# Patient Record
Sex: Female | Born: 1970 | Race: Black or African American | Hispanic: No | Smoking: Never smoker
Health system: Southern US, Community
[De-identification: ages and names within clinical notes are randomized; demographics above are authoritative.]

## PROBLEM LIST (undated history)

## (undated) DIAGNOSIS — F32A Depression, unspecified: Secondary | ICD-10-CM

## (undated) DIAGNOSIS — D649 Anemia, unspecified: Secondary | ICD-10-CM

## (undated) DIAGNOSIS — F329 Major depressive disorder, single episode, unspecified: Secondary | ICD-10-CM

## (undated) DIAGNOSIS — K219 Gastro-esophageal reflux disease without esophagitis: Secondary | ICD-10-CM

## (undated) HISTORY — PX: OTHER SURGICAL HISTORY: SHX169

## (undated) HISTORY — PX: UTERINE FIBROID EMBOLIZATION: SHX825

---

## 2000-01-26 HISTORY — PX: LAPAROTOMY: SHX154

## 2000-03-01 ENCOUNTER — Ambulatory Visit: Admission: RE | Admit: 2000-03-01 | Discharge: 2000-03-01 | Payer: Self-pay | Admitting: Gynecology

## 2000-03-30 ENCOUNTER — Encounter (INDEPENDENT_AMBULATORY_CARE_PROVIDER_SITE_OTHER): Payer: Self-pay | Admitting: Specialist

## 2000-03-30 ENCOUNTER — Inpatient Hospital Stay (HOSPITAL_COMMUNITY): Admission: RE | Admit: 2000-03-30 | Discharge: 2000-03-31 | Payer: Self-pay | Admitting: Gynecology

## 2000-06-14 ENCOUNTER — Ambulatory Visit: Admission: RE | Admit: 2000-06-14 | Discharge: 2000-06-14 | Payer: Self-pay | Admitting: Gynecology

## 2000-07-06 ENCOUNTER — Other Ambulatory Visit: Admission: RE | Admit: 2000-07-06 | Discharge: 2000-07-06 | Payer: Self-pay | Admitting: Gynecology

## 2000-07-12 ENCOUNTER — Ambulatory Visit: Admission: RE | Admit: 2000-07-12 | Discharge: 2000-07-12 | Payer: Self-pay | Admitting: Gynecology

## 2001-07-20 ENCOUNTER — Other Ambulatory Visit: Admission: RE | Admit: 2001-07-20 | Discharge: 2001-07-20 | Payer: Self-pay | Admitting: Gynecology

## 2002-08-17 ENCOUNTER — Other Ambulatory Visit: Admission: RE | Admit: 2002-08-17 | Discharge: 2002-08-17 | Payer: Self-pay | Admitting: Gynecology

## 2003-07-31 ENCOUNTER — Other Ambulatory Visit: Admission: RE | Admit: 2003-07-31 | Discharge: 2003-07-31 | Payer: Self-pay | Admitting: Gynecology

## 2004-08-13 ENCOUNTER — Other Ambulatory Visit: Admission: RE | Admit: 2004-08-13 | Discharge: 2004-08-13 | Payer: Self-pay | Admitting: Gynecology

## 2005-07-26 ENCOUNTER — Other Ambulatory Visit: Admission: RE | Admit: 2005-07-26 | Discharge: 2005-07-26 | Payer: Self-pay | Admitting: Gynecology

## 2006-08-10 ENCOUNTER — Other Ambulatory Visit: Admission: RE | Admit: 2006-08-10 | Discharge: 2006-08-10 | Payer: Self-pay | Admitting: Gynecology

## 2007-08-25 ENCOUNTER — Other Ambulatory Visit: Admission: RE | Admit: 2007-08-25 | Discharge: 2007-08-25 | Payer: Self-pay | Admitting: Gynecology

## 2010-05-07 ENCOUNTER — Other Ambulatory Visit (HOSPITAL_COMMUNITY): Payer: Self-pay | Admitting: Gynecology

## 2010-05-07 DIAGNOSIS — N979 Female infertility, unspecified: Secondary | ICD-10-CM

## 2010-05-12 ENCOUNTER — Ambulatory Visit (HOSPITAL_COMMUNITY)
Admission: RE | Admit: 2010-05-12 | Discharge: 2010-05-12 | Disposition: A | Discharge status: Home / Self Care | Payer: BC Managed Care – PPO | Source: Ambulatory Visit | Attending: Gynecology | Admitting: Gynecology

## 2010-05-12 DIAGNOSIS — N979 Female infertility, unspecified: Secondary | ICD-10-CM

## 2010-06-12 NOTE — Consult Note (Signed)
[facility]  Patient:    Valerie Barnes, Valerie Barnes                  MRN: [id number] Proc. Date: 07/12/00 Adm. Date:  [id number] Attending:  Jeannette Corpus CC:         [name], M.D.  Telford Nab, R.N.   Consultation Report  HISTORY OF PRESENT ILLNESS:  A 40 year old African-American female returns for follow-up of separation of the posterior fornix following partial vaginectomy and resection of rectus vaginal septum endometriosis deposit.  Since she was seen on May 21, she denies any vaginal bleeding or discharge and has no pelvic pain or pressure.  She has no GI or GU symptoms.  PHYSICAL EXAMINATION:  ABDOMEN:  Soft and nontender.  No masses, organomegaly, ascites, or hernias are noted.  Her incision is well healed.  PELVIC:  EGBUS normal.  The vagina is clean.  The cervix is nulliparous and clean.  Inspection of the vagina, including the posterior cul-de-sac, shows no lesions or separation.  Mucosa appears healthy.  Bimanual exam reveals some thickening in the posterior cul-de-sac without any discrete masses.  This is slightly uncomfortable for the patient.  IMPRESSION:  Good postoperative rehealing.  The patient can resume sexual intercourse.  We will have her return to Dr. Teodora Medici, her primary gynecologist. DD:  07/12/00 TD:  07/12/00 Job: [id number]

## 2010-06-12 NOTE — Op Note (Signed)
[facility]  Patient:    Valerie Barnes, Valerie Barnes                  MRN: [id number] Proc. Date: 03/30/00 Adm. Date:  [id number] Attending:  Rolinda Roan CC:         [name], M.D.  Christin Bach, M.D., [place], c/o [facility]  Telford Nab, R.N.   Operative Report  PREOPERATIVE DIAGNOSES: 1. Pelvic pain. 2. Rectovaginal septum mass.  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  OPERATION: 1. Exploratory laparotomy. 2. Resection of mass from rectovaginal septum and posterior vagina. 3. Repair of posterior vagina colpotomy.  SURGEONS:  Daniel L. Clarke-Pearson, M.D.  ASSISTANT:  1. [name], M.D.             2. Telford Nab, R.N.  ANESTHESIA:  General with orotracheal tube.  ESTIMATED BLOOD LOSS: 50 cc.  SURGICAL FINDINGS:  At examination under anesthesia, there was noted to be an approximately 3 cm nodular mass immediately adjacent to the posterior cervix in the rectovaginal septum.  At exploratory laparotomy, the tubes and ovaries appeared normal.  The uterus had a 5 mm fibroid on its fundus.  There was no evidence of endometriosis on any peritoneal surfaces.  However, there was a mass measuring approximately 3 cm in the rectovaginal septum which, on frozen section, was determined to be endometriosis.  No gross disease is left behind at the completion of the surgical procedure.  DESCRIPTION OF PROCEDURE:  The patient was brought to the operating room, and after satisfactory attainment of general anesthesia, she was examined with the above-noted findings.  The abdomen, perineum, and vagina were prepped with Betadine, Foley catheter was placed, and the patient was draped.  The abdomen was entered through a Pfannenstiel incision and explored with the above-noted findings.  Buchwalter retractor was assembled, and the bowel was packed out of the pelvis.  Care was taken to avoid  pressure on the psoas muscle.  The uterus was held forward using a narrow Deaver on the Buchwalter retractor with care taken to avoid injury to the fallopian tubes and ovaries.  With the cul-de-sac exposed, an incision was made in the posterior cul-de-sac.  using blunt and sharp dissection and cautery for hemostasis, the rectum was mobilized away from the posterior vagina.  The incision in the peritoneum was then extended anteriorly along the back of the cervix and superior cul-de-sac.  The mass itself was put on traction with a suture placed through the mass.  Using sharp and blunt dissection and Bovie cautery for hemostasis, the mass was resected along with a 1 x 2 cm portion of the posterior vagina.  Inspection suggested that the lesion extended into two sinus tracts in the posterior vagina.  The mass is submitted for frozen section, and we were told that this is endometriosis.  The posterior vagina was closed with a series of interrupted figure-of-eight sutures of 2-0 Vicryl.  The posterior cul-de-sac peritoneum was reincorporated into the posterior vagina, and the peritoneum adjacent to the cervix with interrupted sutures of 2-0 Vicryl.  The pelvis was irrigated with copious amounts of warm saline.  All blood and clot was removed.  This was reinspected.  Hemostasis appeared to be excellent.  The packs and retractors were removed, and the anterior abdominal wall closed in layers, the first being a running suture of 2-0 Vicryl on the peritoneum. Hemostasis was achieved in the subfascial region and the fascia closed with a running  suture of 0 Vicryl.  Subcutaneous tissue was irrigated.  Hemostasis was achieved with cautery, and the skin was closed with skin staples.  A dressing was applied.  The patient was awakened from anesthesia and taken to the recovery room in satisfactory condition.  Sponge, needle, and instrument counts were correct x 2. DD:  03/30/00 TD:  03/30/00 Job:  [id number]

## 2010-06-12 NOTE — Consult Note (Signed)
[facility]  Patient:    Valerie Barnes, Valerie Barnes                  MRN: [id number] Proc. Date: 06/14/00 Adm. Date:  [id number] Attending:  Jeannette Corpus CC:         [name], M.D.             Telford Nab, R.N.                          Consultation Report  HISTORY OF PRESENT ILLNESS:  [age]-year-old African-American female seen at the request of Dr. Dimas Aguas C. Mezer.  She underwent resection of a posterior cul-de-sac nodule and upper portion of the posterior cul-de-sac and vagina on March 6th for severe endometriosis involving the rectovaginal septum.  She had an uncomplicated postoperative course and has done reasonably well, until she had intercourse several days ago.  She noted vaginal bleeding thereafter and saw Dr. Chevis Pretty, who thought there was a separation in the posterior vagina, and she is referred here for further evaluation.  She denies any pelvic pain, pressure, vaginal bleeding, discharge or fever or chills.  She has no abnormal GI or GU function.  PHYSICAL EXAMINATION:  GENERAL:  Weight 150 pounds.  ABDOMEN:  Soft, nontender.  No mass, organomegaly, ascites or herniae noted. Her incision is well-healed.  PELVIC:  EGBUS normal.  The vagina is clean up to the posterior fornix.  There is some slight separation of the mucosa, although there is no break through the stroma of the vagina.  This is carefully inspected and carefully palpated. Cervix otherwise appeared normal.  IMPRESSION:  Most likely, the patient has had a slight tear in the vagina, although I do not see that this is a through-and-through tear.  PLAN:  I have given the patient a prescription for Premarin cream to be used every other night for the next two weeks and I will have her return to see me. She will abstain from intercourse during that time. DD:  06/14/00 TD:  06/15/00 Job: [id number]

## 2010-06-12 NOTE — H&P (Signed)
[facility]  Patient:    Valerie Barnes, Valerie Barnes                         MRN: [id number] Attending:  [name], M.D. CC:         Christin Bach, M.D., [place], [place]  Rande Brunt. Clarke-Pearson, M.D.   History and Physical  ADMISSION DIAGNOSIS:  Pelvic pain, question pelvic mass.  HISTORY OF PRESENT ILLNESS:  The patient is a 40 year old nulligravida black female who was kindly referred by Dr. Christin Bach for evaluation and treatment of pelvic pain.  The patient presented in January 2002 complaining of a long history of severe dysmenorrhea and pelvic pain since November 2001. The pain occurs quite frequently and is mostly at midline with radiation to the legs.  The patient denies any change in bowel habits or significant bowel symptoms.  No dysuria.  She has had no sexually transmitted diseases and has not been recently sexually active.  She has had bleeding from the posterior apex of the vagina and Dr. Emelda Fear performed a biopsy of this area and the pathology report returned as nodules of granulation tissue exhibiting deposits of hemosiderin in the submucosa and acanthotic focally parakeratotic with stratified squamous mucosa overlying them with no glandular or stromal components of endometriosis identified.  On examination, there appeared to be a firm area at the cul-de-sac between the uterosacral ligaments, which did not extend to the rectum.  As this finding was suggestive of endometriosis but not typical of rectovaginal endometriosis as the rectosigmoid was not drawn up into this area, an upper GI with small bowel follow-through was obtained, which was negative.  The patient has been seen in consultation with Dr. Serita Kyle and he was unable to appreciate the cul-de-sac mass. Because of this uncertainty of the diagnosis, the proposed procedure is to perform examination under anesthesia with probable laparoscopy and laparotomy if indicated.  The  patient understands the uncertainty of the diagnosis and the potential extent of the procedures up to and including a bowel resection and anastomosis.  The patient also understands that there is a possibility that there will be no significant pathology found either at the EUA or the laparoscopy.  Laparoscopy and laparotomy with potential complications including but not limited to injury to the bowel, bladder, ureters, and possible infection and possible blood loss or transfusion and its sequelae. She also understands that there is no guarantee to find the cause of or to relieve her pelvic pain.  The patient has undergone a bowel prep preoperatively.  The patient appears to understand the complexity of the desicion-making process and the rationale for the above procedures.  PAST SURGICAL HISTORY:  None.  PAST MEDICAL HISTORY:  Noncontributory.  MEDICATIONS: 1. Prozac. 2. Lo/Ovral.  ALLERGIES:  CODEINE.  SOCIAL HISTORY:  Smokes none, ETOH none.  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  FAMILY HISTORY:  Noncontributory.  PHYSICAL EXAMINATION:  HEENT:  Negative.  HEART:  Without murmurs.  LUNGS:  Clear.  BREASTS:  Without masses or discharge.  ABDOMEN:  Soft and nontender.  PELVIC:  BUS, vagina, and cervix normal except for a small bleeding area at the posterior aspect of the vagina, which appears to be firm.  The cervix is normal.  The uterus is anterior in normal position.  The adnexa are without masses.  Rectal exam is negative.  On rectovaginal exam, there appears to be a  firm area in the cul-de-sac between the uterosacral ligaments, which has not extended to the rectum.  EXTREMITIES:  Negative.  IMPRESSION:  Pelvic pain and dysmenorrhea.  Question pelvic mass.  PLAN:  Examination under anesthesia, diagnostic laparoscopy, question laparotomy with indicated procedures. DD:  03/30/00 TD:  03/30/00 Job:  [id number]

## 2010-06-12 NOTE — Consult Note (Signed)
[facility]  Patient:    Valerie Barnes, Valerie Barnes                       MRN: [id number] Proc. Date: 03/01/00 Adm. Date:  [id number] Attending:  Jeannette Corpus CC:         Valerie Barnes, M.D.  Christin Bach, M.D. [address] [place], [place] [address]   Consultation Report  HISTORY OF PRESENT ILLNESS:  The patient is a 40 year old African-American female referred by Dr. Teodora Medici in consultation regarding management of pelvic pain and a newly diagnosed cul-de-sac mass.  The patient has been on birth control pills for approximately 16 years, more recently, given in a continuous fashion for treatment of presumed endometriosis.  The patient has a history of chronic pelvic pain.  More recently, she has had some irregular vaginal bleeding and pelvic pain which is diffuse and throughout the pelvis, ultimately becoming a "throbbing" pain in the upper vagina.  The patient denies any rectal symptoms, and has no GI or GU symptoms.  From a gynecologic perspective, she has no significant past history.  She has no history of abnormal Pap smears, ovarian cysts, or any gynecologic surgery. She has not ever had laparoscopy, and the diagnosis of endometriosis is presumed.  The patient apparently had a lesion in the posterior vagina which was biopsied by Dr. Christin Bach in [place].  This showed hemosiderin, but did not show any endometrial glands or stroma.  She has been referred to Dr. Chevis Pretty for evaluation and management of presumed endometriosis.  A cul-de-sac mass was felt, and surgery was scheduled to manage this mass.  The patient has had an upper GI series which was normal, ruling out Crohns disease.  PAST MEDICAL HISTORY:  None.  PAST SURGICAL HISTORY:  None.  ALLERGIES:  CODEINE.  CURRENT MEDICATIONS:  Oral contraceptives.  FAMILY HISTORY:  Negative for gynecologic, breast, or colon cancers.  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  PHYSICAL EXAMINATION:  VITAL SIGNS:  Height 5 feet 3 inches, weight 146 pounds, blood pressure 136/84, pulse 84, respiratory rate 24.  GENERAL:  The patient is a pleasant healthy African-American female in no acute distress.  HEENT:  Negative.  NECK:  Supple without thyromegaly.  ABDOMEN:  Soft, nontender, no masses, organomegaly, ascites, or hernias are noted.  PELVIC:  EGBUS normal.  The vagina seems normal.  I do not see any lesions in the posterior vagina today.  The cervix is nulliparous.  Bimanual examination reveals an anterior uterus.  She has very hard stool in the rectum, but I am unable to identify a true cul-de-sac mass or any other adnexal pathology.  IMPRESSION:  Chronic pelvic pain and irregular bleeding.  The patient may well have endometriosis, and it may be that I am missing detecting the mass because the patient has such hard stool in her rectum.  Dr. Chevis Pretty and I have consulted over the phone, and we have agreed to proceed with further evaluation, including examination under anesthesia, and thereafter to determine whether we should proceed with either diagnostic laparoscopy, or proceed immediately to a laparotomy.  I had a lengthy discussion with the patient and her mother regarding these management options.  It was emphasized that if she does have endometriosis in her posterior cul-de-sac, surgical resection would be appropriate, and that we would make every effort to avoid compromising fertility.  On the other hand, they  are aware that this surgery in cul-de-sac may result in injury to the rectum and/or rectal resection with reanastomosis.  She will have a mechanical bowel prep and intravenous antibiotics perioperatively.  The risks of surgery, including hemorrhage, infection, injury to adjacent organs, and thromboembolic complications were outlined to the patient and her mother.  They are in agreement, and we will  proceed with surgery on March 30, 2000. DD:  03/01/00 TD:  03/02/00 Job: [id number]

## 2010-08-25 ENCOUNTER — Other Ambulatory Visit: Payer: Self-pay | Admitting: Gynecology

## 2010-08-25 DIAGNOSIS — R928 Other abnormal and inconclusive findings on diagnostic imaging of breast: Secondary | ICD-10-CM

## 2010-09-01 ENCOUNTER — Ambulatory Visit
Admission: RE | Admit: 2010-09-01 | Discharge: 2010-09-01 | Disposition: A | Discharge status: Home / Self Care | Payer: BC Managed Care – PPO | Source: Ambulatory Visit | Attending: Gynecology | Admitting: Gynecology

## 2010-09-01 DIAGNOSIS — R928 Other abnormal and inconclusive findings on diagnostic imaging of breast: Secondary | ICD-10-CM

## 2010-09-03 ENCOUNTER — Other Ambulatory Visit: Payer: BC Managed Care – PPO

## 2011-12-10 ENCOUNTER — Other Ambulatory Visit: Payer: Self-pay | Admitting: Gynecology

## 2011-12-10 DIAGNOSIS — D259 Leiomyoma of uterus, unspecified: Secondary | ICD-10-CM

## 2011-12-10 DIAGNOSIS — N920 Excessive and frequent menstruation with regular cycle: Secondary | ICD-10-CM

## 2011-12-16 ENCOUNTER — Ambulatory Visit
Admission: RE | Admit: 2011-12-16 | Discharge: 2011-12-16 | Disposition: A | Discharge status: Home / Self Care | Payer: BC Managed Care – PPO | Source: Ambulatory Visit | Attending: Gynecology | Admitting: Gynecology

## 2011-12-16 DIAGNOSIS — N920 Excessive and frequent menstruation with regular cycle: Secondary | ICD-10-CM

## 2011-12-16 DIAGNOSIS — D259 Leiomyoma of uterus, unspecified: Secondary | ICD-10-CM

## 2011-12-19 ENCOUNTER — Ambulatory Visit
Admission: RE | Admit: 2011-12-19 | Discharge: 2011-12-19 | Disposition: A | Discharge status: Home / Self Care | Payer: BC Managed Care – PPO | Source: Ambulatory Visit | Attending: Gynecology | Admitting: Gynecology

## 2011-12-19 DIAGNOSIS — D259 Leiomyoma of uterus, unspecified: Secondary | ICD-10-CM

## 2011-12-19 DIAGNOSIS — N920 Excessive and frequent menstruation with regular cycle: Secondary | ICD-10-CM

## 2011-12-19 MED ORDER — GADOBENATE DIMEGLUMINE 529 MG/ML IV SOLN
15.0000 mL | Freq: Once | INTRAVENOUS | Status: AC | PRN
  Administered 2011-12-19: 15 mL via INTRAVENOUS

## 2011-12-22 ENCOUNTER — Telehealth: Payer: Self-pay | Admitting: Emergency Medicine

## 2011-12-22 NOTE — Telephone Encounter (Signed)
S/W PT. DR Fredia Sorrow SAID MRI WAS GOOD TO GO FOR UFE.  WILL SUBMIT TO INS. AND HAVE TINA AT [facility] TO CONTACT PT. TO SET UP PROCEDURE.

## 2011-12-30 ENCOUNTER — Telehealth: Payer: Self-pay | Admitting: Emergency Medicine

## 2011-12-30 NOTE — Telephone Encounter (Signed)
LM TO MAKE PT. AWARE THAT [place] WAS APPROVED BY INS.

## 2012-01-18 ENCOUNTER — Encounter (HOSPITAL_COMMUNITY): Payer: Self-pay | Admitting: Pharmacy Technician

## 2012-01-25 ENCOUNTER — Other Ambulatory Visit: Payer: Self-pay | Admitting: Radiology

## 2012-01-27 ENCOUNTER — Other Ambulatory Visit: Payer: Self-pay | Admitting: Interventional Radiology

## 2012-01-27 DIAGNOSIS — D259 Leiomyoma of uterus, unspecified: Secondary | ICD-10-CM

## 2012-01-28 ENCOUNTER — Observation Stay (HOSPITAL_COMMUNITY)
Admission: RE | Admit: 2012-01-28 | Discharge: 2012-01-29 | Disposition: A | Discharge status: Home / Self Care | Payer: BC Managed Care – PPO | Source: Ambulatory Visit | Attending: Interventional Radiology | Admitting: Interventional Radiology

## 2012-01-28 ENCOUNTER — Ambulatory Visit (HOSPITAL_COMMUNITY)
Admission: RE | Admit: 2012-01-28 | Discharge: 2012-01-28 | Disposition: A | Discharge status: Home / Self Care | Payer: BC Managed Care – PPO | Source: Ambulatory Visit | Attending: Interventional Radiology | Admitting: Interventional Radiology

## 2012-01-28 ENCOUNTER — Other Ambulatory Visit: Payer: Self-pay | Admitting: Interventional Radiology

## 2012-01-28 ENCOUNTER — Encounter (HOSPITAL_COMMUNITY): Payer: Self-pay

## 2012-01-28 VITALS — BP 120/67 | HR 86 | Temp 99.3°F | Resp 16 | Ht 65.0 in | Wt 160.0 lb

## 2012-01-28 DIAGNOSIS — D259 Leiomyoma of uterus, unspecified: Secondary | ICD-10-CM

## 2012-01-28 LAB — BASIC METABOLIC PANEL
BUN: 12 mg/dL (ref 6–23)
BUN: 11 mg/dL (ref 6–23)
CO2: 24 mEq/L (ref 19–32)
CO2: 24 mEq/L (ref 19–32)
Calcium: 9.6 mg/dL (ref 8.4–10.5)
Calcium: 9.1 mg/dL (ref 8.4–10.5)
Chloride: 98 mEq/L (ref 96–112)
Chloride: 100 mEq/L (ref 96–112)
Creatinine, Ser: 0.73 mg/dL (ref 0.50–1.10)
Creatinine, Ser: 0.76 mg/dL (ref 0.50–1.10)
GFR calc Af Amer: >90 mL/min (ref >90)
GFR calc Af Amer: >90 mL/min (ref >90)
GFR calc non Af Amer: >90 mL/min (ref >90)
GFR calc non Af Amer: >90 mL/min (ref >90)
Glucose, Bld: 76 mg/dL (ref 70–99)
Glucose, Bld: 86 mg/dL (ref 70–99)
Potassium: 5.9 mEq/L — ABNORMAL HIGH (ref 3.5–5.1)
Potassium: 3.6 mEq/L (ref 3.5–5.1)
Sodium: 132 mEq/L — ABNORMAL LOW (ref 135–145)
Sodium: 134 mEq/L — ABNORMAL LOW (ref 135–145)

## 2012-01-28 LAB — CBC
HCT: 37.7 % (ref 36.0–46.0)
Hemoglobin: 12.5 g/dL (ref 12.0–15.0)
MCH: 27.5 pg (ref 26.0–34.0)
MCHC: 33.2 g/dL (ref 30.0–36.0)
MCV: 83 fL (ref 78.0–100.0)
Platelets: 274 10*3/uL (ref 150–400)
RBC: 4.54 MIL/uL (ref 3.87–5.11)
RDW: 13.2 % (ref 11.5–15.5)
WBC: 7.1 10*3/uL (ref 4.0–10.5)

## 2012-01-28 LAB — HCG, SERUM, QUALITATIVE: Preg, Serum: NEGATIVE

## 2012-01-28 LAB — APTT: aPTT: 31 seconds (ref 24–37)

## 2012-01-28 LAB — PROTIME-INR
INR: 0.97 (ref 0.00–1.49)
Prothrombin Time: 12.8 seconds (ref 11.6–15.2)

## 2012-01-28 MED ORDER — DOCUSATE SODIUM 100 MG PO CAPS
100.0000 mg | ORAL_CAPSULE | Freq: Two times a day (BID) | ORAL | Status: DC
  Administered 2012-01-28 – 2012-01-29 (×2): 100 mg via ORAL
  Filled 2012-01-28: qty 1

## 2012-01-28 MED ORDER — HYDROMORPHONE HCL PF 2 MG/ML IJ SOLN
2.0000 mg | Freq: Once | INTRAMUSCULAR | Status: AC
  Administered 2012-01-28: 2 mg via INTRAVENOUS
  Filled 2012-01-28: qty 1

## 2012-01-28 MED ORDER — DIPHENHYDRAMINE HCL 50 MG/ML IJ SOLN
12.5000 mg | Freq: Four times a day (QID) | INTRAMUSCULAR | Status: DC | PRN
  Filled 2012-01-28: qty 1

## 2012-01-28 MED ORDER — SODIUM CHLORIDE 0.45 % IV SOLN
INTRAVENOUS | Status: DC
  Administered 2012-01-28 – 2012-01-29 (×2): via INTRAVENOUS

## 2012-01-28 MED ORDER — ONDANSETRON HCL 4 MG/2ML IJ SOLN
4.0000 mg | Freq: Four times a day (QID) | INTRAMUSCULAR | Status: DC | PRN
  Administered 2012-01-29 (×2): 4 mg via INTRAVENOUS

## 2012-01-28 MED ORDER — SODIUM CHLORIDE 0.9 % IJ SOLN: 3.0000 mL | INTRAMUSCULAR | Status: DC | PRN

## 2012-01-28 MED ORDER — LIDOCAINE HCL 1 % IJ SOLN
INTRAMUSCULAR | Status: AC
  Administered 2012-01-28: 11:00:00
  Filled 2012-01-28: qty 20

## 2012-01-28 MED ORDER — FENTANYL CITRATE 0.05 MG/ML IJ SOLN
INTRAMUSCULAR | Status: AC
  Filled 2012-01-28: qty 8

## 2012-01-28 MED ORDER — FENTANYL CITRATE 0.05 MG/ML IJ SOLN
INTRAMUSCULAR | Status: AC | PRN
  Administered 2012-01-28: 50 ug via INTRAVENOUS
  Administered 2012-01-28: 100 ug via INTRAVENOUS
  Administered 2012-01-28: 50 ug via INTRAVENOUS
  Administered 2012-01-28: 100 ug via INTRAVENOUS

## 2012-01-28 MED ORDER — KETOROLAC TROMETHAMINE 30 MG/ML IJ SOLN
30.0000 mg | INTRAMUSCULAR | Status: AC
  Administered 2012-01-28: 30 mg via INTRAVENOUS
  Filled 2012-01-28: qty 1

## 2012-01-28 MED ORDER — SODIUM CHLORIDE 0.9 % IJ SOLN: 9.0000 mL | INTRAMUSCULAR | Status: DC | PRN

## 2012-01-28 MED ORDER — LORATADINE 10 MG PO TABS
10.0000 mg | ORAL_TABLET | Freq: Every day | ORAL | Status: DC
  Administered 2012-01-29: 10 mg via ORAL
  Filled 2012-01-28 (×2): qty 1

## 2012-01-28 MED ORDER — PROMETHAZINE HCL 25 MG RE SUPP
25.0000 mg | Freq: Three times a day (TID) | RECTAL | Status: DC | PRN
  Filled 2012-01-28: qty 1

## 2012-01-28 MED ORDER — HYDROMORPHONE HCL PF 2 MG/ML IJ SOLN
INTRAMUSCULAR | Status: AC
  Filled 2012-01-28: qty 1

## 2012-01-28 MED ORDER — IOHEXOL 300 MG/ML  SOLN
55.0000 mL | Freq: Once | INTRAMUSCULAR | Status: AC | PRN
  Administered 2012-01-28: 55 mL via INTRA_ARTERIAL

## 2012-01-28 MED ORDER — DIPHENHYDRAMINE HCL 50 MG/ML IJ SOLN
25.0000 mg | Freq: Once | INTRAMUSCULAR | Status: AC
  Administered 2012-01-28: 25 mg via INTRAVENOUS
  Filled 2012-01-28: qty 1

## 2012-01-28 MED ORDER — FENTANYL 10 MCG/ML IV SOLN
INTRAVENOUS | Status: DC
  Administered 2012-01-28: 165 ug via INTRAVENOUS
  Administered 2012-01-28 (×2): via INTRAVENOUS
  Administered 2012-01-28: 105 ug via INTRAVENOUS
  Administered 2012-01-29: 150 ug via INTRAVENOUS
  Administered 2012-01-29: 75 ug via INTRAVENOUS
  Filled 2012-01-28 (×2): qty 50

## 2012-01-28 MED ORDER — CEFAZOLIN SODIUM 1-5 GM-% IV SOLN
1.0000 g | INTRAVENOUS | Status: DC
  Filled 2012-01-28: qty 50

## 2012-01-28 MED ORDER — ONDANSETRON HCL 4 MG/2ML IJ SOLN
4.0000 mg | Freq: Four times a day (QID) | INTRAMUSCULAR | Status: DC | PRN
  Administered 2012-01-28: 4 mg via INTRAVENOUS
  Filled 2012-01-28 (×3): qty 2

## 2012-01-28 MED ORDER — SODIUM CHLORIDE 0.9 % IJ SOLN: 3.0000 mL | Freq: Two times a day (BID) | INTRAMUSCULAR | Status: DC

## 2012-01-28 MED ORDER — PROMETHAZINE HCL 25 MG PO TABS
25.0000 mg | ORAL_TABLET | Freq: Three times a day (TID) | ORAL | Status: DC | PRN
  Administered 2012-01-29: 25 mg via ORAL
  Filled 2012-01-28: qty 1

## 2012-01-28 MED ORDER — MIDAZOLAM HCL 2 MG/2ML IJ SOLN
INTRAMUSCULAR | Status: AC
  Filled 2012-01-28: qty 8

## 2012-01-28 MED ORDER — NALOXONE HCL 0.4 MG/ML IJ SOLN: 0.4000 mg | INTRAMUSCULAR | Status: DC | PRN

## 2012-01-28 MED ORDER — SODIUM CHLORIDE 0.9 % IV SOLN: 250.0000 mL | INTRAVENOUS | Status: DC | PRN

## 2012-01-28 MED ORDER — MIDAZOLAM HCL 2 MG/2ML IJ SOLN
INTRAMUSCULAR | Status: AC | PRN
  Administered 2012-01-28: 0.5 mg via INTRAVENOUS
  Administered 2012-01-28: 2 mg via INTRAVENOUS
  Administered 2012-01-28: 1 mg via INTRAVENOUS
  Administered 2012-01-28: 0.5 mg via INTRAVENOUS

## 2012-01-28 MED ORDER — CEFAZOLIN SODIUM 1-5 GM-% IV SOLN
1.0000 g | INTRAVENOUS | Status: AC
  Administered 2012-01-28: 1 g via INTRAVENOUS

## 2012-01-28 MED ORDER — KETOROLAC TROMETHAMINE 30 MG/ML IJ SOLN
30.0000 mg | Freq: Four times a day (QID) | INTRAMUSCULAR | Status: DC
  Administered 2012-01-28 – 2012-01-29 (×4): 30 mg via INTRAVENOUS
  Filled 2012-01-28 (×3): qty 1

## 2012-01-28 MED ORDER — DIPHENHYDRAMINE HCL 12.5 MG/5ML PO ELIX
12.5000 mg | ORAL_SOLUTION | Freq: Four times a day (QID) | ORAL | Status: DC | PRN
  Filled 2012-01-28: qty 5

## 2012-01-28 NOTE — H&P (Signed)
Valerie Barnes is an 42 y.o. female.   Chief Complaint: fibroids, prolonged/painful menstrual bleeding HPI: Patient with symptomatic uterine fibroids presents today for bilateral uterine artery embolization.  PMH: endometriosis, bordeline elevated cholesterol  Past Surgical History  Procedure Date  . Laparotomy 2002    endometrial mass removed    FH:  Father with renal failure and metastatic lung cancer. Mother with HTN. DM in grandparents.  Social History: Married, lives in [place] ,[place]; [place].Denies alcohol or tobacco use.  Allergies:  Allergies  Allergen Reactions  . Codeine Hives  . Morphine And Related Hives    Current outpatient prescriptions:cetirizine (ZYRTEC) 10 MG tablet, Take 10 mg by mouth daily., Disp: , Rfl: ;  ibuprofen (ADVIL,MOTRIN) 200 MG tablet, Take 200 mg by mouth every 6 (six) hours as needed., Disp: , Rfl: ;  Multiple Vitamin (MULTIVITAMIN WITH MINERALS) TABS, Take 1 tablet by mouth daily., Disp: , Rfl: ;  vitamin E 400 UNIT capsule, Take 400 Units by mouth daily., Disp: , Rfl:  Current facility-administered medications:0.45 % sodium chloride infusion, , Intravenous, Continuous, D Jeananne Rama, PA, Last Rate: 75 mL/hr at 01/28/12 0854;  ceFAZolin (ANCEF) IVPB 1 g/50 mL premix, 1 g, Intravenous, On Call, D Jeananne Rama, PA  01/28/12 labs pending Review of Systems  Constitutional: Negative for fever and chills.  Respiratory: Negative for cough and shortness of breath.   Cardiovascular: Negative for chest pain.  Gastrointestinal: Negative for nausea and vomiting.  Genitourinary:       Hx poor bladder emptying, menorrhagia, dysmenorrhea  Musculoskeletal: Negative for back pain.  Neurological: Negative for headaches.    Blood pressure 119/82, pulse 80, temperature 98.2 F (36.8 C), temperature source Oral, resp. rate 18, height 5\' 5"  (1.651 m), weight 160 lb (72.576 kg), last menstrual period 01/14/2012, SpO2 100.00%. Physical Exam    Constitutional: She is oriented to person, place, and time. She appears well-developed and well-nourished.  Cardiovascular: Normal rate and regular rhythm.   Respiratory: Effort normal and breath sounds normal.  GI: Soft. Bowel sounds are normal. There is no tenderness.       Fibroid uterus  Musculoskeletal: Normal range of motion. She exhibits no edema.  Neurological: She is alert and oriented to person, place, and time.     Assessment/Plan Pt with hx symptomatic uterine fibroids. Plan is for bilateral uterine artery embolization today. Details/risks of procedure d/w pt/family with their understanding and consent.  ALLRED,D KEVIN 01/28/2012, 8:47 AM

## 2012-01-28 NOTE — Procedures (Signed)
Procedure:  Bilateral uterine artery embolization Access:  Right CFA, 5 Fr sheath Findings:  Bilateral uterine arteries supply hypervascular branches to fibroids.  Left UA embolized with 2 vials 500-700 micron and 3/4 vial 700-900 micron Embospheres.  Right UA embolized with a vial of 500-700 micron Embospheres.  Good result. Plan:  Overnight observation

## 2012-01-28 NOTE — H&P (Signed)
Agree 

## 2012-01-28 NOTE — Progress Notes (Addendum)
Subjective: Pt drowsy, c/o pelvic cramping, intermittent nausea, occ dull aching in both legs  Objective: Vital signs in last 24 hours: Temp:  [97.9 F (36.6 C)-98.2 F (36.8 C)] 97.9 F (36.6 C) (01/03 1241) Pulse Rate:  [71-88] 88  (01/03 1612) Resp:  [7-20] 12  (01/03 1612) BP: (113-134)/(65-87) 124/77 mmHg (01/03 1612) SpO2:  [90 %-100 %] 100 % (01/03 1612) Weight:  [160 lb (72.576 kg)] 160 lb (72.576 kg) (01/03 1241) Last BM Date: 01/27/12  Intake/Output from previous day:   Intake/Output this shift: Total I/O In: -  Out: 600 [Urine:600]  Pt drowsy but able to answer questions; right groin puncture site clean and dry, NT, no hematoma; intact rt/left distal pulses  Lab Results:   Basename 01/28/12 0830  WBC 7.1  HGB 12.5  HCT 37.7  PLT 274   BMET  Basename 01/28/12 0935 01/28/12 0830  NA 134* 132*  K 3.6 5.9*  CL 100 98  CO2 24 24  GLUCOSE 86 76  BUN 11 12  CREATININE 0.76 0.73  CALCIUM 9.1 9.6   PT/INR  Basename 01/28/12 0830  LABPROT 12.8  INR 0.97   ABG No results found for this basename: PHART:2,PCO2:2,PO2:2,HCO3:2 in the last 72 hours  Studies/Results: Ir Angiogram Pelvis Selective Or Supraselective  01/28/2012  *RADIOLOGY REPORT*  Clinical Data: Symptomatic uterine fibroids with menorrhagia and dysmenorrhea.  UTERINE FIBROID EMBOLIZATION WITH ULTRASOUND GUIDED ACCESS OF THE RIGHT COMMON FEMORAL ARTERY, SELECTIVE CATHETERIZATION OF BILATERAL INTERNAL ILIAC ARTERIES, SELECTIVE UTERINE ARTERIOGRAPHY BILATERALLY AND TRANSCATHETER EMBOLIZATION OF BILATERAL UTERINE ARTERIES.  Sedation: 4.0 mg IV Versed; 300 mcg IV Fentanyl.  Total Moderate Sedation Time: 60 minutes.  Contrast Volume: 55 ml  Additional Medications: 30 mg IV Toradol, 1 gram IV Ancef.  As antibiotic prophylaxis, Ancef was ordered pre-procedure and administered intravenously within one hour of incision.  Fluoroscropy Time: 18.9 minutes.  Procedure:  The procedure, risks, benefits, and  alternatives were explained to the patient.  Questions regarding the procedure were encouraged and answered.  The patient understands and consents to the procedure.  The right groin was prepped with Betadine in a sterile fashion, and a sterile drape was applied covering the operative field.  A sterile gown and sterile gloves were used for the procedure.  Local anesthesia was provided with 1% Lidocaine.  After small skin incision, a 21 gauge needle was advanced into the right common femoral artery under direct ultrasound guidance. Ultrasound image documentation was performed.  After establishing guide wire access, a 5-French sheath was placed.  A 5 Fr diagnostic catheter was advanced over a guidewire into the distal abdominal aorta.  The catheter was then used to selectively catheterize the left common iliac artery followed by the left internal iliac artery.  Contrast injection was performed.  A 2.8 Fr coaxial microcatheter was then introduced through the diagnostic catheter and advanced into the left uterine artery over a guide wire utilizing roadmapping technique.  Selective arteriography of the left uterine artery was performed through the microcatheter.  Left uterine artery embolization was then performed with installation of microsphere particles.  Follow-up arteriography was performed after embolization.  The microcatheter was removed.  The diagnostic catheter was then retracted and used to selectively catheterize the right internal iliac artery.  Contrast injection was performed.  The microcatheter was then reintroduced and advanced into the right uterine artery over a guidewire utilizing roadmapping technique.  Selective arteriography of the right uterine artery was then performed through the microcatheter.  Right uterine artery embolization  was then performed with installation of microsphere particles.  Follow-up arteriography was performed after embolization.  Right femoral arteriotomy hemostasis: Cordis  ExoSeal  Complications: None  Findings:  Bilateral uterine arteriography shows multiple enlarged hypervascular trunks supplying uterine fibroids.  Left uterine artery embolization was performed utilizing 2 vials of 500 - 700 micron sized and 3/4 vial of 700/900 micron sized Embosphere particles.  Completion arteriography demonstrates adequate occlusion of branches supplying uterine fibroids.  Right uterine artery embolization was performed utilizing one vial of 500 - 700 micron sized Embosphere particles.  Completion arteriography demonstrates adequate occlusion of branches supplying uterine fibroids.  Adequate hemostasis was achieved at the femoral arteriotomy site.  IMPRESSION: Successful bilateral uterine artery embolization to treat symptomatic uterine fibroid disease.  Adequate occlusion of branch vessels supplying uterine fibroids was achieved with microsphere particle embolization.  The patient was admitted for overnight observation for treatment of post embolization symptoms.  Initial clinical follow-up will be performed in 2 weeks.   Original Report Authenticated By: Irish Lack, M.D.    Ir Angiogram Pelvis Selective Or Supraselective  01/28/2012  *RADIOLOGY REPORT*  Clinical Data: Symptomatic uterine fibroids with menorrhagia and dysmenorrhea.  UTERINE FIBROID EMBOLIZATION WITH ULTRASOUND GUIDED ACCESS OF THE RIGHT COMMON FEMORAL ARTERY, SELECTIVE CATHETERIZATION OF BILATERAL INTERNAL ILIAC ARTERIES, SELECTIVE UTERINE ARTERIOGRAPHY BILATERALLY AND TRANSCATHETER EMBOLIZATION OF BILATERAL UTERINE ARTERIES.  Sedation: 4.0 mg IV Versed; 300 mcg IV Fentanyl.  Total Moderate Sedation Time: 60 minutes.  Contrast Volume: 55 ml  Additional Medications: 30 mg IV Toradol, 1 gram IV Ancef.  As antibiotic prophylaxis, Ancef was ordered pre-procedure and administered intravenously within one hour of incision.  Fluoroscropy Time: 18.9 minutes.  Procedure:  The procedure, risks, benefits, and alternatives were  explained to the patient.  Questions regarding the procedure were encouraged and answered.  The patient understands and consents to the procedure.  The right groin was prepped with Betadine in a sterile fashion, and a sterile drape was applied covering the operative field.  A sterile gown and sterile gloves were used for the procedure.  Local anesthesia was provided with 1% Lidocaine.  After small skin incision, a 21 gauge needle was advanced into the right common femoral artery under direct ultrasound guidance. Ultrasound image documentation was performed.  After establishing guide wire access, a 5-French sheath was placed.  A 5 Fr diagnostic catheter was advanced over a guidewire into the distal abdominal aorta.  The catheter was then used to selectively catheterize the left common iliac artery followed by the left internal iliac artery.  Contrast injection was performed.  A 2.8 Fr coaxial microcatheter was then introduced through the diagnostic catheter and advanced into the left uterine artery over a guide wire utilizing roadmapping technique.  Selective arteriography of the left uterine artery was performed through the microcatheter.  Left uterine artery embolization was then performed with installation of microsphere particles.  Follow-up arteriography was performed after embolization.  The microcatheter was removed.  The diagnostic catheter was then retracted and used to selectively catheterize the right internal iliac artery.  Contrast injection was performed.  The microcatheter was then reintroduced and advanced into the right uterine artery over a guidewire utilizing roadmapping technique.  Selective arteriography of the right uterine artery was then performed through the microcatheter.  Right uterine artery embolization was then performed with installation of microsphere particles.  Follow-up arteriography was performed after embolization.  Right femoral arteriotomy hemostasis: Cordis ExoSeal   Complications: None  Findings:  Bilateral uterine arteriography shows multiple enlarged  hypervascular trunks supplying uterine fibroids.  Left uterine artery embolization was performed utilizing 2 vials of 500 - 700 micron sized and 3/4 vial of 700/900 micron sized Embosphere particles.  Completion arteriography demonstrates adequate occlusion of branches supplying uterine fibroids.  Right uterine artery embolization was performed utilizing one vial of 500 - 700 micron sized Embosphere particles.  Completion arteriography demonstrates adequate occlusion of branches supplying uterine fibroids.  Adequate hemostasis was achieved at the femoral arteriotomy site.  IMPRESSION: Successful bilateral uterine artery embolization to treat symptomatic uterine fibroid disease.  Adequate occlusion of branch vessels supplying uterine fibroids was achieved with microsphere particle embolization.  The patient was admitted for overnight observation for treatment of post embolization symptoms.  Initial clinical follow-up will be performed in 2 weeks.   Original Report Authenticated By: Irish Lack, M.D.    Ir Angiogram Selective Each Additional Vessel  01/28/2012  *RADIOLOGY REPORT*  Clinical Data: Symptomatic uterine fibroids with menorrhagia and dysmenorrhea.  UTERINE FIBROID EMBOLIZATION WITH ULTRASOUND GUIDED ACCESS OF THE RIGHT COMMON FEMORAL ARTERY, SELECTIVE CATHETERIZATION OF BILATERAL INTERNAL ILIAC ARTERIES, SELECTIVE UTERINE ARTERIOGRAPHY BILATERALLY AND TRANSCATHETER EMBOLIZATION OF BILATERAL UTERINE ARTERIES.  Sedation: 4.0 mg IV Versed; 300 mcg IV Fentanyl.  Total Moderate Sedation Time: 60 minutes.  Contrast Volume: 55 ml  Additional Medications: 30 mg IV Toradol, 1 gram IV Ancef.  As antibiotic prophylaxis, Ancef was ordered pre-procedure and administered intravenously within one hour of incision.  Fluoroscropy Time: 18.9 minutes.  Procedure:  The procedure, risks, benefits, and alternatives were explained to the  patient.  Questions regarding the procedure were encouraged and answered.  The patient understands and consents to the procedure.  The right groin was prepped with Betadine in a sterile fashion, and a sterile drape was applied covering the operative field.  A sterile gown and sterile gloves were used for the procedure.  Local anesthesia was provided with 1% Lidocaine.  After small skin incision, a 21 gauge needle was advanced into the right common femoral artery under direct ultrasound guidance. Ultrasound image documentation was performed.  After establishing guide wire access, a 5-French sheath was placed.  A 5 Fr diagnostic catheter was advanced over a guidewire into the distal abdominal aorta.  The catheter was then used to selectively catheterize the left common iliac artery followed by the left internal iliac artery.  Contrast injection was performed.  A 2.8 Fr coaxial microcatheter was then introduced through the diagnostic catheter and advanced into the left uterine artery over a guide wire utilizing roadmapping technique.  Selective arteriography of the left uterine artery was performed through the microcatheter.  Left uterine artery embolization was then performed with installation of microsphere particles.  Follow-up arteriography was performed after embolization.  The microcatheter was removed.  The diagnostic catheter was then retracted and used to selectively catheterize the right internal iliac artery.  Contrast injection was performed.  The microcatheter was then reintroduced and advanced into the right uterine artery over a guidewire utilizing roadmapping technique.  Selective arteriography of the right uterine artery was then performed through the microcatheter.  Right uterine artery embolization was then performed with installation of microsphere particles.  Follow-up arteriography was performed after embolization.  Right femoral arteriotomy hemostasis: Cordis ExoSeal  Complications: None   Findings:  Bilateral uterine arteriography shows multiple enlarged hypervascular trunks supplying uterine fibroids.  Left uterine artery embolization was performed utilizing 2 vials of 500 - 700 micron sized and 3/4 vial of 700/900 micron sized Embosphere particles.  Completion arteriography demonstrates adequate  occlusion of branches supplying uterine fibroids.  Right uterine artery embolization was performed utilizing one vial of 500 - 700 micron sized Embosphere particles.  Completion arteriography demonstrates adequate occlusion of branches supplying uterine fibroids.  Adequate hemostasis was achieved at the femoral arteriotomy site.  IMPRESSION: Successful bilateral uterine artery embolization to treat symptomatic uterine fibroid disease.  Adequate occlusion of branch vessels supplying uterine fibroids was achieved with microsphere particle embolization.  The patient was admitted for overnight observation for treatment of post embolization symptoms.  Initial clinical follow-up will be performed in 2 weeks.   Original Report Authenticated By: Irish Lack, M.D.    Ir Angiogram Selective Each Additional Vessel  01/28/2012  *RADIOLOGY REPORT*  Clinical Data: Symptomatic uterine fibroids with menorrhagia and dysmenorrhea.  UTERINE FIBROID EMBOLIZATION WITH ULTRASOUND GUIDED ACCESS OF THE RIGHT COMMON FEMORAL ARTERY, SELECTIVE CATHETERIZATION OF BILATERAL INTERNAL ILIAC ARTERIES, SELECTIVE UTERINE ARTERIOGRAPHY BILATERALLY AND TRANSCATHETER EMBOLIZATION OF BILATERAL UTERINE ARTERIES.  Sedation: 4.0 mg IV Versed; 300 mcg IV Fentanyl.  Total Moderate Sedation Time: 60 minutes.  Contrast Volume: 55 ml  Additional Medications: 30 mg IV Toradol, 1 gram IV Ancef.  As antibiotic prophylaxis, Ancef was ordered pre-procedure and administered intravenously within one hour of incision.  Fluoroscropy Time: 18.9 minutes.  Procedure:  The procedure, risks, benefits, and alternatives were explained to the patient.  Questions  regarding the procedure were encouraged and answered.  The patient understands and consents to the procedure.  The right groin was prepped with Betadine in a sterile fashion, and a sterile drape was applied covering the operative field.  A sterile gown and sterile gloves were used for the procedure.  Local anesthesia was provided with 1% Lidocaine.  After small skin incision, a 21 gauge needle was advanced into the right common femoral artery under direct ultrasound guidance. Ultrasound image documentation was performed.  After establishing guide wire access, a 5-French sheath was placed.  A 5 Fr diagnostic catheter was advanced over a guidewire into the distal abdominal aorta.  The catheter was then used to selectively catheterize the left common iliac artery followed by the left internal iliac artery.  Contrast injection was performed.  A 2.8 Fr coaxial microcatheter was then introduced through the diagnostic catheter and advanced into the left uterine artery over a guide wire utilizing roadmapping technique.  Selective arteriography of the left uterine artery was performed through the microcatheter.  Left uterine artery embolization was then performed with installation of microsphere particles.  Follow-up arteriography was performed after embolization.  The microcatheter was removed.  The diagnostic catheter was then retracted and used to selectively catheterize the right internal iliac artery.  Contrast injection was performed.  The microcatheter was then reintroduced and advanced into the right uterine artery over a guidewire utilizing roadmapping technique.  Selective arteriography of the right uterine artery was then performed through the microcatheter.  Right uterine artery embolization was then performed with installation of microsphere particles.  Follow-up arteriography was performed after embolization.  Right femoral arteriotomy hemostasis: Cordis ExoSeal  Complications: None  Findings:  Bilateral  uterine arteriography shows multiple enlarged hypervascular trunks supplying uterine fibroids.  Left uterine artery embolization was performed utilizing 2 vials of 500 - 700 micron sized and 3/4 vial of 700/900 micron sized Embosphere particles.  Completion arteriography demonstrates adequate occlusion of branches supplying uterine fibroids.  Right uterine artery embolization was performed utilizing one vial of 500 - 700 micron sized Embosphere particles.  Completion arteriography demonstrates adequate occlusion of branches supplying uterine fibroids.  Adequate hemostasis was achieved at the femoral arteriotomy site.  IMPRESSION: Successful bilateral uterine artery embolization to treat symptomatic uterine fibroid disease.  Adequate occlusion of branch vessels supplying uterine fibroids was achieved with microsphere particle embolization.  The patient was admitted for overnight observation for treatment of post embolization symptoms.  Initial clinical follow-up will be performed in 2 weeks.   Original Report Authenticated By: Irish Lack, M.D.    Ir US Guide Vasc Access Right  01/28/2012  *RADIOLOGY REPORT*  Clinical Data: Symptomatic uterine fibroids with menorrhagia and dysmenorrhea.  UTERINE FIBROID EMBOLIZATION WITH ULTRASOUND GUIDED ACCESS OF THE RIGHT COMMON FEMORAL ARTERY, SELECTIVE CATHETERIZATION OF BILATERAL INTERNAL ILIAC ARTERIES, SELECTIVE UTERINE ARTERIOGRAPHY BILATERALLY AND TRANSCATHETER EMBOLIZATION OF BILATERAL UTERINE ARTERIES.  Sedation: 4.0 mg IV Versed; 300 mcg IV Fentanyl.  Total Moderate Sedation Time: 60 minutes.  Contrast Volume: 55 ml  Additional Medications: 30 mg IV Toradol, 1 gram IV Ancef.  As antibiotic prophylaxis, Ancef was ordered pre-procedure and administered intravenously within one hour of incision.  Fluoroscropy Time: 18.9 minutes.  Procedure:  The procedure, risks, benefits, and alternatives were explained to the patient.  Questions regarding the procedure were  encouraged and answered.  The patient understands and consents to the procedure.  The right groin was prepped with Betadine in a sterile fashion, and a sterile drape was applied covering the operative field.  A sterile gown and sterile gloves were used for the procedure.  Local anesthesia was provided with 1% Lidocaine.  After small skin incision, a 21 gauge needle was advanced into the right common femoral artery under direct ultrasound guidance. Ultrasound image documentation was performed.  After establishing guide wire access, a 5-French sheath was placed.  A 5 Fr diagnostic catheter was advanced over a guidewire into the distal abdominal aorta.  The catheter was then used to selectively catheterize the left common iliac artery followed by the left internal iliac artery.  Contrast injection was performed.  A 2.8 Fr coaxial microcatheter was then introduced through the diagnostic catheter and advanced into the left uterine artery over a guide wire utilizing roadmapping technique.  Selective arteriography of the left uterine artery was performed through the microcatheter.  Left uterine artery embolization was then performed with installation of microsphere particles.  Follow-up arteriography was performed after embolization.  The microcatheter was removed.  The diagnostic catheter was then retracted and used to selectively catheterize the right internal iliac artery.  Contrast injection was performed.  The microcatheter was then reintroduced and advanced into the right uterine artery over a guidewire utilizing roadmapping technique.  Selective arteriography of the right uterine artery was then performed through the microcatheter.  Right uterine artery embolization was then performed with installation of microsphere particles.  Follow-up arteriography was performed after embolization.  Right femoral arteriotomy hemostasis: Cordis ExoSeal  Complications: None  Findings:  Bilateral uterine arteriography shows  multiple enlarged hypervascular trunks supplying uterine fibroids.  Left uterine artery embolization was performed utilizing 2 vials of 500 - 700 micron sized and 3/4 vial of 700/900 micron sized Embosphere particles.  Completion arteriography demonstrates adequate occlusion of branches supplying uterine fibroids.  Right uterine artery embolization was performed utilizing one vial of 500 - 700 micron sized Embosphere particles.  Completion arteriography demonstrates adequate occlusion of branches supplying uterine fibroids.  Adequate hemostasis was achieved at the femoral arteriotomy site.  IMPRESSION: Successful bilateral uterine artery embolization to treat symptomatic uterine fibroid disease.  Adequate occlusion of branch vessels supplying uterine fibroids was achieved with microsphere  particle embolization.  The patient was admitted for overnight observation for treatment of post embolization symptoms.  Initial clinical follow-up will be performed in 2 weeks.   Original Report Authenticated By: Irish Lack, M.D.    Ir Embo Tumor Organ Ischemia Infarct Inc Guide Roadmapping  01/28/2012  *RADIOLOGY REPORT*  Clinical Data: Symptomatic uterine fibroids with menorrhagia and dysmenorrhea.  UTERINE FIBROID EMBOLIZATION WITH ULTRASOUND GUIDED ACCESS OF THE RIGHT COMMON FEMORAL ARTERY, SELECTIVE CATHETERIZATION OF BILATERAL INTERNAL ILIAC ARTERIES, SELECTIVE UTERINE ARTERIOGRAPHY BILATERALLY AND TRANSCATHETER EMBOLIZATION OF BILATERAL UTERINE ARTERIES.  Sedation: 4.0 mg IV Versed; 300 mcg IV Fentanyl.  Total Moderate Sedation Time: 60 minutes.  Contrast Volume: 55 ml  Additional Medications: 30 mg IV Toradol, 1 gram IV Ancef.  As antibiotic prophylaxis, Ancef was ordered pre-procedure and administered intravenously within one hour of incision.  Fluoroscropy Time: 18.9 minutes.  Procedure:  The procedure, risks, benefits, and alternatives were explained to the patient.  Questions regarding the procedure were  encouraged and answered.  The patient understands and consents to the procedure.  The right groin was prepped with Betadine in a sterile fashion, and a sterile drape was applied covering the operative field.  A sterile gown and sterile gloves were used for the procedure.  Local anesthesia was provided with 1% Lidocaine.  After small skin incision, a 21 gauge needle was advanced into the right common femoral artery under direct ultrasound guidance. Ultrasound image documentation was performed.  After establishing guide wire access, a 5-French sheath was placed.  A 5 Fr diagnostic catheter was advanced over a guidewire into the distal abdominal aorta.  The catheter was then used to selectively catheterize the left common iliac artery followed by the left internal iliac artery.  Contrast injection was performed.  A 2.8 Fr coaxial microcatheter was then introduced through the diagnostic catheter and advanced into the left uterine artery over a guide wire utilizing roadmapping technique.  Selective arteriography of the left uterine artery was performed through the microcatheter.  Left uterine artery embolization was then performed with installation of microsphere particles.  Follow-up arteriography was performed after embolization.  The microcatheter was removed.  The diagnostic catheter was then retracted and used to selectively catheterize the right internal iliac artery.  Contrast injection was performed.  The microcatheter was then reintroduced and advanced into the right uterine artery over a guidewire utilizing roadmapping technique.  Selective arteriography of the right uterine artery was then performed through the microcatheter.  Right uterine artery embolization was then performed with installation of microsphere particles.  Follow-up arteriography was performed after embolization.  Right femoral arteriotomy hemostasis: Cordis ExoSeal  Complications: None  Findings:  Bilateral uterine arteriography shows  multiple enlarged hypervascular trunks supplying uterine fibroids.  Left uterine artery embolization was performed utilizing 2 vials of 500 - 700 micron sized and 3/4 vial of 700/900 micron sized Embosphere particles.  Completion arteriography demonstrates adequate occlusion of branches supplying uterine fibroids.  Right uterine artery embolization was performed utilizing one vial of 500 - 700 micron sized Embosphere particles.  Completion arteriography demonstrates adequate occlusion of branches supplying uterine fibroids.  Adequate hemostasis was achieved at the femoral arteriotomy site.  IMPRESSION: Successful bilateral uterine artery embolization to treat symptomatic uterine fibroid disease.  Adequate occlusion of branch vessels supplying uterine fibroids was achieved with microsphere particle embolization.  The patient was admitted for overnight observation for treatment of post embolization symptoms.  Initial clinical follow-up will be performed in 2 weeks.   Original Report Authenticated By:  Irish Lack, M.D.     Anti-infectives: Anti-infectives     Start     Dose/Rate Route Frequency Ordered Stop   01/28/12 0800   ceFAZolin (ANCEF) IVPB 1 g/50 mL premix  Status:  Discontinued        1 g 100 mL/hr over 30 Minutes Intravenous On call 01/28/12 0752 01/28/12 1118          Assessment/Plan: s/p bilateral uterine artery embolization secondary to symptomatic uterine fibroids; for overnight obs for pain control- fentanyl PCA with intermittent dilaudid if needed, antiemetics. Check am labs. F/u with Dr. Fredia Sorrow in IR clinic in 2 weeks.  LOS: 0 days    ALLRED,D [facility] 01/28/2012

## 2012-01-29 LAB — BASIC METABOLIC PANEL
BUN: 9 mg/dL (ref 6–23)
CO2: 23 mEq/L (ref 19–32)
Calcium: 9.1 mg/dL (ref 8.4–10.5)
Chloride: 97 mEq/L (ref 96–112)
Creatinine, Ser: 0.62 mg/dL (ref 0.50–1.10)
GFR calc Af Amer: >90 mL/min (ref >90)
GFR calc non Af Amer: >90 mL/min (ref >90)
Glucose, Bld: 97 mg/dL (ref 70–99)
Potassium: 3.9 mEq/L (ref 3.5–5.1)
Sodium: 132 mEq/L — ABNORMAL LOW (ref 135–145)

## 2012-01-29 LAB — CBC
HCT: 35.2 % — ABNORMAL LOW (ref 36.0–46.0)
Hemoglobin: 12 g/dL (ref 12.0–15.0)
MCH: 27.9 pg (ref 26.0–34.0)
MCHC: 34.1 g/dL (ref 30.0–36.0)
MCV: 81.9 fL (ref 78.0–100.0)
Platelets: 291 10*3/uL (ref 150–400)
RBC: 4.3 MIL/uL (ref 3.87–5.11)
RDW: 13 % (ref 11.5–15.5)
WBC: 11.7 10*3/uL — ABNORMAL HIGH (ref 4.0–10.5)

## 2012-01-29 MED ORDER — HYDROMORPHONE HCL PF 2 MG/ML IJ SOLN
2.0000 mg | Freq: Once | INTRAMUSCULAR | Status: AC
  Administered 2012-01-29: 2 mg via INTRAVENOUS
  Filled 2012-01-29: qty 1

## 2012-01-29 MED ORDER — DIPHENHYDRAMINE HCL 50 MG/ML IJ SOLN
25.0000 mg | Freq: Once | INTRAMUSCULAR | Status: AC
  Administered 2012-01-29: 25 mg via INTRAVENOUS

## 2012-01-29 NOTE — Discharge Summary (Signed)
Physician Discharge Summary  Patient ID: Valerie Barnes MRN: [id number] DOB/AGE: 09-15-1970 42 y.o.  Admit date: 01/28/2012 Discharge date: 01/29/2012  Admission Diagnoses: Symptomatic Uterine fibroids  Discharge Diagnoses: Bilateral Uterine Fibroids with treatment Active Problems:  * No active hospital problems. *    Discharged Condition: improved; stable  Hospital Course: Pt was treated for symptomatic uterine fibroids. Hx of menorrhagia and dysmenorrhea. Bilateral Uterine fibroid Embolization performed in Int Radiology with Dr Fredia Sorrow 01/28/2012. Pt tolerated the procedure well. Did develop post procedure N/V. Has not had any vomiting since last pm. Nausea improved; only slight now. Also has had post procedure low abdominal cramping which is expected.  Much better today. Slept well; urinating well. Has not had BM. Belching but not passed gas yet.   Labs this am show slight increase in WBC. Afebrile. Increase in wbc most likely due to post embolic syndrome; which is often times seen post procedure. Afeb and without any sign of infection. Pt has been seen and examined. Discussed case with Dr Lowella Dandy. Will discharge pt to home. Instructions reviewed with pt and husband with good understanding. Continue all home meds. RX: Ibuprofen 600 mg #30; Phenergan 25 mg #10; Vicodin 5/500 mg #30; Colace 100 mg #20. Pt to return to see Dr Fredia Sorrow in clinic 2-4 weeks.   Consults: none  Significant Diagnostic Studies: Bilateral Uterine artery arteriogram  Treatments: Bilateral Uterine artery embolization  Results for orders placed during the hospital encounter of 01/28/12  APTT      Component Value Range   aPTT 31  24 - 37 seconds  BASIC METABOLIC PANEL      Component Value Range   Sodium 132 (*) 135 - 145 mEq/L   Potassium 5.9 (*) 3.5 - 5.1 mEq/L   Chloride 98  96 - 112 mEq/L   CO2 24  19 - 32 mEq/L   Glucose, Bld 76  70 - 99 mg/dL   BUN 12  6 - 23 mg/dL   Creatinine, Ser 2.13  0.50 -  1.10 mg/dL   Calcium 9.6  8.4 - 08.6 mg/dL   GFR calc non Af Amer >90  >90 mL/min   GFR calc Af Amer >90  >90 mL/min  CBC      Component Value Range   WBC 7.1  4.0 - 10.5 K/uL   RBC 4.54  3.87 - 5.11 MIL/uL   Hemoglobin 12.5  12.0 - 15.0 g/dL   HCT 57.8  46.9 - 62.9 %   MCV 83.0  78.0 - 100.0 fL   MCH 27.5  26.0 - 34.0 pg   MCHC 33.2  30.0 - 36.0 g/dL   RDW 52.8  41.3 - 24.4 %   Platelets 274  150 - 400 K/uL  HCG, SERUM, QUALITATIVE      Component Value Range   Preg, Serum NEGATIVE  NEGATIVE  PROTIME-INR      Component Value Range   Prothrombin Time 12.8  11.6 - 15.2 seconds   INR 0.97  0.00 - 1.49  BASIC METABOLIC PANEL      Component Value Range   Sodium 134 (*) 135 - 145 mEq/L   Potassium 3.6  3.5 - 5.1 mEq/L   Chloride 100  96 - 112 mEq/L   CO2 24  19 - 32 mEq/L   Glucose, Bld 86  70 - 99 mg/dL   BUN 11  6 - 23 mg/dL   Creatinine, Ser 0.10  0.50 - 1.10 mg/dL   Calcium 9.1  8.4 -  10.5 mg/dL   GFR calc non Af Amer >90  >90 mL/min   GFR calc Af Amer >90  >90 mL/min  CBC      Component Value Range   WBC 11.7 (*) 4.0 - 10.5 K/uL   RBC 4.30  3.87 - 5.11 MIL/uL   Hemoglobin 12.0  12.0 - 15.0 g/dL   HCT 40.9 (*) 81.1 - 91.4 %   MCV 81.9  78.0 - 100.0 fL   MCH 27.9  26.0 - 34.0 pg   MCHC 34.1  30.0 - 36.0 g/dL   RDW 78.2  95.6 - 21.3 %   Platelets 291  150 - 400 K/uL  BASIC METABOLIC PANEL      Component Value Range   Sodium 132 (*) 135 - 145 mEq/L   Potassium 3.9  3.5 - 5.1 mEq/L   Chloride 97  96 - 112 mEq/L   CO2 23  19 - 32 mEq/L   Glucose, Bld 97  70 - 99 mg/dL   BUN 9  6 - 23 mg/dL   Creatinine, Ser 0.86  0.50 - 1.10 mg/dL   Calcium 9.1  8.4 - 57.8 mg/dL   GFR calc non Af Amer >90  >90 mL/min   GFR calc Af Amer >90  >90 mL/min    Discharge Exam: Blood pressure 120/60, pulse 85, temperature 98.3 F (36.8 C), temperature source Oral, resp. rate 13, height 5\' 5"  (1.651 m), weight 160 lb (72.576 kg), last menstrual period 01/14/2012, SpO2 99.00%.   PE:   VSS; afeb A/O; appropriate UOP 800 cc: clear yellow Heart: RRR Lungs: CTA Ext: FROM; slow but ambulatory Abd: soft; no masses; sl tender low abd Rt groin NT; no hematoma Clean and dry Lt groin NT Rt foot: 2+ pulses Lt foot:  2+ pulses  Disposition:  B Uterine Artery Embolization 01/28/2012 in IR with Dr Fredia Sorrow Pt has done well Initial N/V resolved. Still with some low abd cramping- expected; better; pt reassured. Cont home meds Rx: Ibu 600 mg; Vicodin 5/500 mg; Phenergan 25 mg; Colace 100 mg. Pt to return to clinic for follow up with Dr Fredia Sorrow 2-4 weeks Tammy will call pt with time and date: [phone] Call [phone] with any questions or concerns.   Discharge Orders    Future Orders Please Complete By Expires   IR Radiologist Eval & Mgmt   03/28/13   Questions: Responses:   Is the patient pregnant? No   Preferred Imaging Location? [facility]   Reason for exam: post [place] 01/28/12; need to see [place] in 2-4 weeks   Diet - low sodium heart healthy      Increase activity slowly      Scheduling Instructions:   May shower today with groin site bandage in place; place new band aid after shower   Discharge instructions      Comments:   Dc to home now; pt to return to OP clinic to see Dr Fredia Sorrow in 2-4 weeks; Tammy will call pt with date and time - [phone]   Driving Restrictions      Comments:   No driving x 5 days   Lifting restrictions      Comments:   No lifting over 10 lbs x 5 days   Call MD for:  temperature >100.4      Call MD for:  persistant nausea and vomiting      Call MD for:  severe uncontrolled pain      Call MD for:  redness,  tenderness, or signs of infection (pain, swelling, redness, odor or green/yellow discharge around incision site)      Discharge wound care:      Comments:   May shower today; replace bandage with new bad aid; and new band aid daily x 5 days       Medication List     As of 01/29/2012 12:11 PM    TAKE these medications          cetirizine 10 MG tablet   Commonly known as: ZYRTEC   Take 10 mg by mouth daily.      ibuprofen 200 MG tablet   Commonly known as: ADVIL,MOTRIN   Take 200 mg by mouth every 6 (six) hours as needed.      multivitamin with minerals Tabs   Take 1 tablet by mouth daily.      vitamin E 400 UNIT capsule   Take 400 Units by mouth daily.         Signed: Calib Wadhwa A 01/29/2012, 12:11 PM

## 2012-01-29 NOTE — Progress Notes (Signed)
Pt c/o no relief from fentanyl PCA.  Notified MD Henn oncall.  Received new orders.  Will continue to monitor pt.  Valerie Barnes 12:22 AM 01/29/2012

## 2012-01-29 NOTE — Progress Notes (Signed)
Patient able to tolerate diet, with minimal nausea. Pt appropriate for discharge. Given all discharge instructions and prescriptions, expressed understanding. Will return home with spouse.

## 2012-01-31 NOTE — Progress Notes (Signed)
Discharge summary sent to payer through [employer]  

## 2012-02-03 ENCOUNTER — Telehealth: Payer: Self-pay | Admitting: Radiology

## 2012-02-03 NOTE — Telephone Encounter (Signed)
Pt called w/ concern about area on Left wrist:  Slightly red and itching of site from hospital ID bracelet from 01/28/2012.   Skin intact.  Pt states that area does not completely encircle wrist.    Per Dr Miles Costain.  Does not feel that patient needs to be seen at present.  Pt instructed to apply OTC Hydrocortisone 1 % cream as needed.  May take OTC Benadryl as directed.   Instructed to call for additional concerns if needed.  Pt states that she understands.

## 2012-02-22 ENCOUNTER — Inpatient Hospital Stay: Admission: RE | Admit: 2012-02-22 | Payer: BC Managed Care – PPO | Source: Ambulatory Visit

## 2012-02-22 ENCOUNTER — Ambulatory Visit
Admission: RE | Admit: 2012-02-22 | Discharge: 2012-02-22 | Disposition: A | Discharge status: Home / Self Care | Payer: BC Managed Care – PPO | Source: Ambulatory Visit | Attending: Radiology | Admitting: Radiology

## 2012-02-22 DIAGNOSIS — D259 Leiomyoma of uterus, unspecified: Secondary | ICD-10-CM

## 2012-02-22 NOTE — Progress Notes (Signed)
LMP:  02/19/2012 with heavy flow w/ occasional small clots.  Intense cramping starting Sunday.  Has been taking Ibuprofen.  States that cramps are slowly improving.  Returned to work on 02/10/2012.

## 2012-02-22 NOTE — Progress Notes (Signed)
Patient ID: Valerie Barnes, female   DOB: 1970/08/20, 42 y.o.   MRN: [id number]  ESTABLISHED PATIENT OFFICE VISIT  Chief Complaint: Status post uterine fibroid embolization procedure on 01/28/2012.  History: [name] returns for follow-up. After the procedure, she did have some postprocedural cramping pain which gradually improved over the course of 1-2 weeks. She had began her first menstrual cycle after the procedure this Saturday and started to have significant cramping pain in her pelvis on Sunday. She has had some heavy flow with this cycle and passage of small clots. Pain currently is waxing and waning and is better today compared to the last 2 days. The patient has been taking approximately 600 mg of ibuprofen 2 to 3 times daily. The patient returned back to work full time on 02/10/2012.  Review of Systems: No fever or chills. No vaginal discharge or passage of tissue.  Exam: Vital signs: Blood pressure 143/84, pulse 79, respirations 15, temperature 98.3, oxygen saturation 100% on room air. General: No acute distress. Abdomen: Mild tenderness to deep palpation at the level of the uterus which remains enlarged and palpable. Right groin: Healed arteriotomy site without tenderness. Normal palpable femoral pulse with no evidence of hematoma or ecchymosis.  Assessment and Plan: I told the patient that it is not abnormal to have some additional pain after the procedure with the first menstrual cycle or two due to inflammation in embolized fibroid tissue. Also, menorrhagia may not noticeably improve until 2-3 months after the procedure with shrinkage of fibroid tissue. There are currently no signs of infection. I have recommended continued use of ibuprofen as needed for pain. Follow-up MRI of the pelvis will be performed in 6 to 9 months to evaluate for decrease in size of fibroids following embolization.

## 2012-03-17 ENCOUNTER — Telehealth: Payer: Self-pay | Admitting: Emergency Medicine

## 2012-03-17 NOTE — Telephone Encounter (Signed)
Pt. Called w/ c/c of increased bleeding/passing clots when she began her cycle on Wed.  Increased pelvic pain and cramping. No fowl smell, chills or sweats.  She is still taking the IBU, she is very tired and depressed.  S/w Dr. Fredia Sorrow at 8:29am-  Reassure pt. That this is normal post procedure for some patients, sometimes the bleeding can become worse before it gets better.   This is not a complication from the procedure and will not prescribe pain meds at this point.  If the bleeding continues to increase, then she needs to see her GYN.  Continue with the IBU.  8:40am- called pt back to make her aware of the above. Instructed pt to see how bleeding occurs over weekend and if not better and still weak to call her GYN to be seen.  I will call back next week to check her status.  Also told pt. If she becomes worse over weekend to go to the ED.

## 2012-03-21 ENCOUNTER — Other Ambulatory Visit (HOSPITAL_COMMUNITY): Payer: Self-pay | Admitting: Interventional Radiology

## 2012-03-21 DIAGNOSIS — D259 Leiomyoma of uterus, unspecified: Secondary | ICD-10-CM

## 2012-03-23 ENCOUNTER — Ambulatory Visit
Admission: RE | Admit: 2012-03-23 | Discharge: 2012-03-23 | Disposition: A | Discharge status: Home / Self Care | Payer: BC Managed Care – PPO | Source: Ambulatory Visit | Attending: Interventional Radiology | Admitting: Interventional Radiology

## 2012-03-23 VITALS — BP 110/79 | HR 82 | Temp 98.7°F | Resp 14

## 2012-03-23 DIAGNOSIS — D259 Leiomyoma of uterus, unspecified: Secondary | ICD-10-CM

## 2012-03-23 NOTE — Progress Notes (Signed)
Pt states that on 03/17/2012 she experienced heavy menstrual flow w/ clots, bright red blood.  Also, experienced cramping & headaches. Took Ibuprofen 600 mg po x 2.  B/P was 136/89.  Rechecked during that day with readings of 134/92: 142/98.  Pt has had school nurse recheck B/P tid during the past several days & requested an office visit w/ Dr Fredia Sorrow b/c of her concerns re:  B/P.

## 2012-03-27 NOTE — Progress Notes (Signed)
Patient ID: Valerie Barnes, female   DOB: 1970-09-07, 42 y.o.   MRN: [id number]  ESTABLISHED PATIENT OFFICE VISIT - LEVEL III [phone])  Chief Complaint: Status post uterine fibroid embolization procedure on 01/28/2012.  History: Valerie Barnes was seen after the procedure on 02/22/2012. Since that time, she has had continued irregular periods every few weeks and states that her overall menstrual bleeding has worsened. She recently finished a cycle and still has some mild spotting. She did have some cramping pain with her last cycle but currently denies any pelvic pain. She states that her blood pressures have been higher than usual at home and when it is checked at work. She is concerned about her blood pressure. The patient has been back at work full time and has had no difficulty performing work related activities or activities of daily living.  Review of Systems: No fever or chills. The patient denies vaginal discharge, passage of tissue or any foul-smelling discharge. She denies hematuria or dysuria.  Exam: Vital signs: Blood pressure 110/79, pulse 82, respirations 14, oxygen saturation 100% on room air, temperature 98.7. General: No acute distress. Abdomen: No palpable abnormalities or tenderness of the abdomen or pelvis. Right groin: No right groin tenderness or hematoma.  Assessment and Plan: The patient documents blood pressure readings over the last few days ranging from 109 - 148 mmHg systolic to 86 - 98 mmHg diastolic. She has no known prior history of hypertension and is not on antihypertensive medication.  With regard to the patient's menorrhagia, I did recommend that we continue to monitor her bleeding. The fibroid embolization procedure was technically successful and I would anticipate gradual shrinkage of fibroid tissue. We will still plan to check an MRI 6 to 9 months after the procedure to reevaluate fibroid enhancement and size. In the meantime, I did  recommend that the patient seek the advice of Dr. Chevis Pretty, if her menorrhagia continues, to discuss any other treatment options such as endometrial ablation or hormonal therapy. The patient also has never had an endometrial biopsy in the past. I did reassure the patient that her blood pressure readings are not alarmingly high but may show a trend of hypertensive changes, especially her diastolic pressures. I recommended that she follow-up with Dr. Margo Common.

## 2012-05-02 ENCOUNTER — Telehealth: Payer: Self-pay | Admitting: Radiology

## 2012-05-02 NOTE — Telephone Encounter (Signed)
3 mo f/u [place] phone call.  Left message on pt's home # to call w/ status update.

## 2012-05-09 ENCOUNTER — Telehealth: Payer: Self-pay | Admitting: Emergency Medicine

## 2012-05-09 NOTE — Telephone Encounter (Signed)
Pt returned our call for her 55mo f/u [place].  Pt,. Stated that she saw Dr. Chevis Pretty and he performed an EBx which came back neg. Her periods are still painful and has a lot of bleeding, but do not last as long.  Dr. Chevis Pretty told pt. That if this continues he will have to do a partial hysterectomy.  We are still giving it some more time to see how her periods pan out from here.

## 2012-07-18 ENCOUNTER — Other Ambulatory Visit: Payer: Self-pay | Admitting: Gynecology

## 2012-07-18 ENCOUNTER — Other Ambulatory Visit (HOSPITAL_COMMUNITY): Payer: Self-pay | Admitting: Interventional Radiology

## 2012-07-18 DIAGNOSIS — D259 Leiomyoma of uterus, unspecified: Secondary | ICD-10-CM

## 2012-07-18 DIAGNOSIS — N920 Excessive and frequent menstruation with regular cycle: Secondary | ICD-10-CM

## 2012-08-29 ENCOUNTER — Ambulatory Visit
Admission: RE | Admit: 2012-08-29 | Discharge: 2012-08-29 | Disposition: A | Discharge status: Home / Self Care | Payer: BC Managed Care – PPO | Source: Ambulatory Visit | Attending: Gynecology | Admitting: Gynecology

## 2012-08-29 DIAGNOSIS — N920 Excessive and frequent menstruation with regular cycle: Secondary | ICD-10-CM

## 2012-08-29 DIAGNOSIS — D259 Leiomyoma of uterus, unspecified: Secondary | ICD-10-CM

## 2012-08-29 MED ORDER — GADOBENATE DIMEGLUMINE 529 MG/ML IV SOLN
14.0000 mL | Freq: Once | INTRAVENOUS | Status: AC | PRN
  Administered 2012-08-29: 14 mL via INTRAVENOUS

## 2012-09-19 ENCOUNTER — Ambulatory Visit
Admission: RE | Admit: 2012-09-19 | Discharge: 2012-09-19 | Disposition: A | Discharge status: Home / Self Care | Payer: BC Managed Care – PPO | Source: Ambulatory Visit | Attending: Interventional Radiology | Admitting: Interventional Radiology

## 2012-09-19 DIAGNOSIS — N920 Excessive and frequent menstruation with regular cycle: Secondary | ICD-10-CM

## 2012-09-19 DIAGNOSIS — D259 Leiomyoma of uterus, unspecified: Secondary | ICD-10-CM

## 2012-09-20 NOTE — Progress Notes (Signed)
LMP:  08/29/2012.  Frequency:  Q 25-28 days.  Length:  4 days.    Patient states that she continues to experience heavy flow & cramping, but her cycles are shorter in length.    Overall, doing well.    Montavius Subramaniam Carmell [place], [place] 09/20/2012 7:59 AM

## 2014-07-01 NOTE — H&P (Signed)
Valerie Barnes  DICTATION # [id number] CSN# [id number]   Margarette Asal, MD 07/01/2014 3:34 PM

## 2014-07-02 NOTE — H&P (Signed)
Valerie Barnes, Valerie Barnes                ACCOUNT NO.:  [id number]  MEDICAL RECORD NO.:  [id number]  LOCATION:                                FACILITY:  [facility]  PHYSICIAN:  Ralene Bathe. Matthew Saras, M.D.DATE OF BIRTH:  [birth year]  DATE OF ADMISSION:  07/18/2014 DATE OF DISCHARGE:                             HISTORY & PHYSICAL   CHIEF COMPLAINT:  Pelvic pain, leiomyoma.  HPI:  [name], G0, P0, patient of Dr. Carren Rang, who has a history of a laparotomy in 2005.  A review of that op note showed that she had endometriosis at that time, that was in the area of the rectovaginal septum.  Stated that at the time, the tubes and ovaries appeared to be normal.  The uterus had a small fibroid noted that was 3 cm rectovaginal mass which on frozen section turned out to be endometriosis.  She, additionally, had [place] to try to alleviate symptoms related to her fibroids without significant improvement that was about 2 years ago. The most recent ultrasound dated April 02, 2014, demonstrated a large intramural fibroid 6.8 x 5 x 5.9, another smaller 2.0 x 1.8 with areas of calcification, ovaries were negative as far as no cyst noted.  Due to problems with continued pain, she presents at this time for TAH bilateral salpingectomy.  This procedure including specific risks related to bleeding, infection, adjacent organ injury, the possible need to complete the surgery by a supracervical technique discussed.  Other risks related to wound infection, phlebitis, rationale for bilateral salpingectomy discussed.  She would prefer at age 40 to keep her otherwise normal ovaries.  Last Pap in September of 2015 was negative.  PAST MEDICAL HISTORY:  ALLERGY:  Seasonal allergies.  Codeine and morphine.  CURRENT MEDICATIONS:  Vitamins, Zyrtec p.r.n.  REVIEW OF SYSTEMS:  Significant for history of headache, arthritis.  FAMILY HISTORY:  Significant for kidney disease, arthritis, diabetes, hypertension, and unspecified  cancer.  SURGICAL HISTORY:  She had a laparotomy in 2005.  SOCIAL HISTORY:  Denies alcohol, tobacco, or drug use.  She is married.  PHYSICAL EXAMINATION:  VITAL SIGNS:  Temp 98.2, blood pressure 120/78. HEENT:  Unremarkable. NECK:  Supple without masses. LUNGS:  Clear. CARDIOVASCULAR:  Regular rate and rhythm without murmurs, rubs, gallops. BREASTS:  Without masses. ABDOMEN:  Soft, flat, nontender.  Vulva, vagina, cervix normal.  Uterus was 12-week size, mid position.  Adnexa negative.  No significant nodularity noted.  IMPRESSION:  Pelvic pain, leiomyoma, history of endometriosis.  PLAN:  TAH, bilateral salpingectomy.  Procedure and risks reviewed as above.  We will also add mechanical bowel preparation to other routine preoperative orders.     Charie Pinkus M. Matthew Saras, M.D.     [facility]/MEDQ  D:  07/01/2014  T:  07/02/2014  Job:  [id number]

## 2014-07-16 ENCOUNTER — Encounter (HOSPITAL_COMMUNITY)
Admission: RE | Admit: 2014-07-16 | Discharge: 2014-07-16 | Disposition: A | Discharge status: Home / Self Care | Payer: BLUE CROSS/BLUE SHIELD | Source: Ambulatory Visit | Attending: Obstetrics and Gynecology | Admitting: Obstetrics and Gynecology

## 2014-07-16 ENCOUNTER — Encounter (HOSPITAL_COMMUNITY): Payer: Self-pay

## 2014-07-16 HISTORY — DX: Gastro-esophageal reflux disease without esophagitis: K21.9

## 2014-07-16 HISTORY — DX: Anemia, unspecified: D64.9

## 2014-07-16 HISTORY — DX: Major depressive disorder, single episode, unspecified: F32.9

## 2014-07-16 HISTORY — DX: Depression, unspecified: F32.A

## 2014-07-16 NOTE — Patient Instructions (Signed)
Your procedure is scheduled on:07/18/14  Enter through the Main Entrance at :6am Pick up desk phone and dial [phone] and inform us of your arrival.  Please call [phone] if you have any problems the morning of surgery.  Remember: Do not eat food or drink liquids, including water, after midnight:WED   You may brush your teeth the morning of surgery.  Take these meds the morning of surgery with a sip of water:Nexium, Wellbutrin  DO NOT wear jewelry, eye make-up, lipstick,body lotion, or dark fingernail polish.  (Polished toes are ok) You may wear deodorant.  If you are to be admitted after surgery, leave suitcase in car until your room has been assigned. Patients discharged on the day of surgery will not be allowed to drive home. Wear loose fitting, comfortable clothes for your ride home.

## 2014-07-17 ENCOUNTER — Encounter (HOSPITAL_COMMUNITY): Payer: Self-pay | Admitting: Anesthesiology

## 2014-07-17 LAB — CBC
HCT: 35 % — ABNORMAL LOW (ref 36.0–46.0)
Hemoglobin: 11.3 g/dL — ABNORMAL LOW (ref 12.0–15.0)
MCH: 28.5 pg (ref 26.0–34.0)
MCHC: 32.3 g/dL (ref 30.0–36.0)
MCV: 88.2 fL (ref 78.0–100.0)
Platelets: 259 K/uL (ref 150–400)
RBC: 3.97 MIL/uL (ref 3.87–5.11)
RDW: 13.9 % (ref 11.5–15.5)
WBC: 6.8 K/uL (ref 4.0–10.5)

## 2014-07-17 LAB — ABO/RH: ABO/RH(D): A POS

## 2014-07-17 LAB — TYPE AND SCREEN
ABO/RH(D): A POS
Antibody Screen: NEGATIVE

## 2014-07-17 MED ORDER — DEXTROSE 5 % IV SOLN
2.0000 g | INTRAVENOUS | Status: AC
  Administered 2014-07-18: 2 g via INTRAVENOUS
  Filled 2014-07-17: qty 2

## 2014-07-17 NOTE — Anesthesia Preprocedure Evaluation (Addendum)
Anesthesia Evaluation  Patient identified by MRN, date of birth, ID band Patient awake    Reviewed: Allergy & Precautions, NPO status , Patient's Chart, lab work & pertinent test results  Airway Mallampati: II  TM Distance: >3 FB Neck ROM: Full    Dental no notable dental hx. (+) Dental Advisory Given   Pulmonary neg pulmonary ROS,  breath sounds clear to auscultation  Pulmonary exam normal       Cardiovascular negative cardio ROS Normal cardiovascular examRhythm:Regular Rate:Normal     Neuro/Psych PSYCHIATRIC DISORDERS Depression negative neurological ROS     GI/Hepatic Neg liver ROS, GERD-  Medicated and Controlled,  Endo/Other  negative endocrine ROS  Renal/GU negative Renal ROS  negative genitourinary   Musculoskeletal negative musculoskeletal ROS (+)   Abdominal   Peds  Hematology  (+) anemia ,   Anesthesia Other Findings   Reproductive/Obstetrics Uterine fibroids DUB                            Anesthesia Physical Anesthesia Plan  ASA: II  Anesthesia Plan: General   Post-op Pain Management:    Induction: Intravenous  Airway Management Planned: Oral ETT  Additional Equipment:   Intra-op Plan:   Post-operative Plan: Extubation in OR  Informed Consent: I have reviewed the patients History and Physical, chart, labs and discussed the procedure including the risks, benefits and alternatives for the proposed anesthesia with the patient or authorized representative who has indicated his/her understanding and acceptance.   Dental advisory given  Plan Discussed with: CRNA, Anesthesiologist and Surgeon  Anesthesia Plan Comments:         Anesthesia Quick Evaluation

## 2014-07-18 ENCOUNTER — Encounter (HOSPITAL_COMMUNITY)
Admission: RE | Disposition: A | Discharge status: Home / Self Care | Payer: Self-pay | Source: Ambulatory Visit | Attending: Obstetrics and Gynecology

## 2014-07-18 ENCOUNTER — Inpatient Hospital Stay (HOSPITAL_COMMUNITY): Payer: BLUE CROSS/BLUE SHIELD | Admitting: Anesthesiology

## 2014-07-18 ENCOUNTER — Inpatient Hospital Stay (HOSPITAL_COMMUNITY)
Admission: RE | Admit: 2014-07-18 | Discharge: 2014-07-20 | DRG: 743 | Disposition: A | Discharge status: Home / Self Care | Payer: BLUE CROSS/BLUE SHIELD | Source: Ambulatory Visit | Attending: Obstetrics and Gynecology | Admitting: Obstetrics and Gynecology

## 2014-07-18 DIAGNOSIS — D219 Benign neoplasm of connective and other soft tissue, unspecified: Secondary | ICD-10-CM | POA: Diagnosis present

## 2014-07-18 DIAGNOSIS — Z79899 Other long term (current) drug therapy: Secondary | ICD-10-CM

## 2014-07-18 DIAGNOSIS — M1990 Unspecified osteoarthritis, unspecified site: Secondary | ICD-10-CM | POA: Diagnosis present

## 2014-07-18 DIAGNOSIS — D259 Leiomyoma of uterus, unspecified: Secondary | ICD-10-CM | POA: Diagnosis present

## 2014-07-18 DIAGNOSIS — R102 Pelvic and perineal pain: Secondary | ICD-10-CM | POA: Diagnosis present

## 2014-07-18 HISTORY — PX: ABDOMINAL HYSTERECTOMY: SHX81

## 2014-07-18 HISTORY — PX: BILATERAL SALPINGECTOMY: SHX5743

## 2014-07-18 LAB — PREGNANCY, URINE: Preg Test, Ur: NEGATIVE

## 2014-07-18 SURGERY — HYSTERECTOMY, ABDOMINAL
Anesthesia: General

## 2014-07-18 MED ORDER — FENTANYL CITRATE (PF) 100 MCG/2ML IJ SOLN
INTRAMUSCULAR | Status: AC
  Filled 2014-07-18: qty 2

## 2014-07-18 MED ORDER — PHENYLEPHRINE 40 MCG/ML (10ML) SYRINGE FOR IV PUSH (FOR BLOOD PRESSURE SUPPORT)
PREFILLED_SYRINGE | INTRAVENOUS | Status: AC
  Filled 2014-07-18: qty 10

## 2014-07-18 MED ORDER — DEXAMETHASONE SODIUM PHOSPHATE 4 MG/ML IJ SOLN
INTRAMUSCULAR | Status: AC
Start: 2014-07-18 — End: 2014-07-18
  Filled 2014-07-18: qty 1

## 2014-07-18 MED ORDER — DEXAMETHASONE SODIUM PHOSPHATE 10 MG/ML IJ SOLN
INTRAMUSCULAR | Status: AC | PRN
  Administered 2014-07-18: 4 mg via INTRAVENOUS

## 2014-07-18 MED ORDER — KETOROLAC TROMETHAMINE 30 MG/ML IJ SOLN: 30.0000 mg | Freq: Four times a day (QID) | INTRAMUSCULAR | Status: DC

## 2014-07-18 MED ORDER — MIDAZOLAM HCL 2 MG/2ML IJ SOLN
INTRAMUSCULAR | Status: AC
  Filled 2014-07-18: qty 2

## 2014-07-18 MED ORDER — HYDROMORPHONE 0.3 MG/ML IV SOLN
INTRAVENOUS | Status: DC
  Administered 2014-07-18: 11:00:00 via INTRAVENOUS
  Administered 2014-07-18: 1.79 mg via INTRAVENOUS
  Administered 2014-07-18: 0.4 mg via INTRAVENOUS
  Administered 2014-07-18: 0.599 mg via INTRAVENOUS
  Administered 2014-07-19: 0.2 mg via INTRAVENOUS
  Administered 2014-07-19: 0.4 mg via INTRAVENOUS
  Filled 2014-07-18: qty 25

## 2014-07-18 MED ORDER — MENTHOL 3 MG MT LOZG: 1.0000 | LOZENGE | OROMUCOSAL | Status: DC | PRN

## 2014-07-18 MED ORDER — ONDANSETRON HCL 4 MG/2ML IJ SOLN
4.0000 mg | Freq: Four times a day (QID) | INTRAMUSCULAR | Status: DC | PRN
  Administered 2014-07-18: 4 mg via INTRAVENOUS
  Filled 2014-07-18: qty 2

## 2014-07-18 MED ORDER — METOCLOPRAMIDE HCL 5 MG/ML IJ SOLN: 10.0000 mg | Freq: Once | INTRAMUSCULAR | Status: DC | PRN

## 2014-07-18 MED ORDER — EPHEDRINE 5 MG/ML INJ
INTRAVENOUS | Status: AC
  Filled 2014-07-18: qty 10

## 2014-07-18 MED ORDER — ROCURONIUM BROMIDE 100 MG/10ML IV SOLN
INTRAVENOUS | Status: AC
  Filled 2014-07-18: qty 1

## 2014-07-18 MED ORDER — FENTANYL CITRATE (PF) 100 MCG/2ML IJ SOLN
INTRAMUSCULAR | Status: AC | PRN
  Administered 2014-07-18 (×2): 50 ug via INTRAVENOUS

## 2014-07-18 MED ORDER — IBUPROFEN 800 MG PO TABS
800.0000 mg | ORAL_TABLET | Freq: Three times a day (TID) | ORAL | Status: DC | PRN
  Administered 2014-07-19 – 2014-07-20 (×2): 800 mg via ORAL
  Filled 2014-07-18 (×2): qty 1

## 2014-07-18 MED ORDER — ONDANSETRON HCL 4 MG/2ML IJ SOLN
INTRAMUSCULAR | Status: AC | PRN
  Administered 2014-07-18: 4 mg via INTRAVENOUS

## 2014-07-18 MED ORDER — KETOROLAC TROMETHAMINE 30 MG/ML IJ SOLN
30.0000 mg | Freq: Four times a day (QID) | INTRAMUSCULAR | Status: DC
  Administered 2014-07-18 – 2014-07-19 (×4): 30 mg via INTRAVENOUS
  Filled 2014-07-18 (×4): qty 1

## 2014-07-18 MED ORDER — FENTANYL CITRATE (PF) 250 MCG/5ML IJ SOLN
INTRAMUSCULAR | Status: AC
  Filled 2014-07-18: qty 5

## 2014-07-18 MED ORDER — ONDANSETRON HCL 4 MG/2ML IJ SOLN: 4.0000 mg | Freq: Four times a day (QID) | INTRAMUSCULAR | Status: DC | PRN

## 2014-07-18 MED ORDER — LORATADINE 10 MG PO TABS
10.0000 mg | ORAL_TABLET | Freq: Every day | ORAL | Status: DC
  Administered 2014-07-18 – 2014-07-19 (×2): 10 mg via ORAL
  Filled 2014-07-18 (×3): qty 1

## 2014-07-18 MED ORDER — DEXTROSE IN LACTATED RINGERS 5 % IV SOLN
INTRAVENOUS | Status: DC
  Administered 2014-07-18 (×2): via INTRAVENOUS

## 2014-07-18 MED ORDER — SCOPOLAMINE 1 MG/3DAYS TD PT72
MEDICATED_PATCH | TRANSDERMAL | Status: AC
  Administered 2014-07-18: 1.5 mg via TRANSDERMAL
  Filled 2014-07-18: qty 1

## 2014-07-18 MED ORDER — LIDOCAINE HCL (CARDIAC) 20 MG/ML IV SOLN
INTRAVENOUS | Status: AC | PRN
  Administered 2014-07-18: 30 mg via INTRAVENOUS

## 2014-07-18 MED ORDER — KETOROLAC TROMETHAMINE 30 MG/ML IJ SOLN
INTRAMUSCULAR | Status: AC
  Filled 2014-07-18: qty 1

## 2014-07-18 MED ORDER — DIPHENHYDRAMINE HCL 12.5 MG/5ML PO ELIX
12.5000 mg | ORAL_SOLUTION | Freq: Four times a day (QID) | ORAL | Status: DC | PRN
  Administered 2014-07-19 – 2014-07-20 (×2): 12.5 mg via ORAL
  Filled 2014-07-18 (×2): qty 5

## 2014-07-18 MED ORDER — OXYCODONE-ACETAMINOPHEN 5-325 MG PO TABS
1.0000 | ORAL_TABLET | ORAL | Status: DC | PRN
  Administered 2014-07-19: 2 via ORAL
  Administered 2014-07-19: 1 via ORAL
  Administered 2014-07-19: 2 via ORAL
  Administered 2014-07-19: 1 via ORAL
  Administered 2014-07-20 (×2): 2 via ORAL
  Filled 2014-07-18 (×3): qty 2
  Filled 2014-07-18: qty 1
  Filled 2014-07-18: qty 2
  Filled 2014-07-18: qty 1

## 2014-07-18 MED ORDER — SCOPOLAMINE 1 MG/3DAYS TD PT72
1.0000 | MEDICATED_PATCH | Freq: Once | TRANSDERMAL | Status: DC
  Administered 2014-07-18: 1.5 mg via TRANSDERMAL

## 2014-07-18 MED ORDER — NALOXONE HCL 0.4 MG/ML IJ SOLN: 0.4000 mg | INTRAMUSCULAR | Status: DC | PRN

## 2014-07-18 MED ORDER — MIDAZOLAM HCL 2 MG/2ML IJ SOLN
INTRAMUSCULAR | Status: AC | PRN
  Administered 2014-07-18: 1 mg via INTRAVENOUS

## 2014-07-18 MED ORDER — BUPROPION HCL ER (SR) 150 MG PO TB12
150.0000 mg | ORAL_TABLET | Freq: Every day | ORAL | Status: DC
  Administered 2014-07-19: 150 mg via ORAL
  Filled 2014-07-18 (×2): qty 1

## 2014-07-18 MED ORDER — ONDANSETRON HCL 4 MG/2ML IJ SOLN
INTRAMUSCULAR | Status: AC
  Filled 2014-07-18: qty 2

## 2014-07-18 MED ORDER — LACTATED RINGERS IV SOLN
INTRAVENOUS | Status: DC
  Administered 2014-07-18 (×2): via INTRAVENOUS

## 2014-07-18 MED ORDER — GLYCOPYRROLATE 0.2 MG/ML IJ SOLN
INTRAMUSCULAR | Status: AC
  Filled 2014-07-18: qty 3

## 2014-07-18 MED ORDER — NEOSTIGMINE METHYLSULFATE 10 MG/10ML IV SOLN
INTRAVENOUS | Status: AC
  Filled 2014-07-18: qty 1

## 2014-07-18 MED ORDER — DIPHENHYDRAMINE HCL 50 MG/ML IJ SOLN: 12.5000 mg | Freq: Four times a day (QID) | INTRAMUSCULAR | Status: DC | PRN

## 2014-07-18 MED ORDER — SODIUM CHLORIDE 0.9 % IJ SOLN: 9.0000 mL | INTRAMUSCULAR | Status: DC | PRN

## 2014-07-18 MED ORDER — ONDANSETRON HCL 4 MG PO TABS: 4.0000 mg | ORAL_TABLET | Freq: Four times a day (QID) | ORAL | Status: DC | PRN

## 2014-07-18 MED ORDER — PROPOFOL 10 MG/ML IV BOLUS
INTRAVENOUS | Status: AC
  Filled 2014-07-18: qty 20

## 2014-07-18 MED ORDER — FENTANYL CITRATE (PF) 100 MCG/2ML IJ SOLN
25.0000 ug | INTRAMUSCULAR | Status: DC | PRN
  Administered 2014-07-18 (×4): 50 ug via INTRAVENOUS

## 2014-07-18 MED ORDER — BUTORPHANOL TARTRATE 1 MG/ML IJ SOLN: 1.0000 mg | INTRAMUSCULAR | Status: DC | PRN

## 2014-07-18 MED ORDER — BUPIVACAINE LIPOSOME 1.3 % IJ SUSP
20.0000 mL | Freq: Once | INTRAMUSCULAR | Status: DC
  Filled 2014-07-18: qty 20

## 2014-07-18 MED ORDER — LIDOCAINE HCL (CARDIAC) 20 MG/ML IV SOLN
INTRAVENOUS | Status: AC
  Filled 2014-07-18: qty 5

## 2014-07-18 MED ORDER — SODIUM CHLORIDE 0.9 % IJ SOLN
INTRAMUSCULAR | Status: AC
  Filled 2014-07-18: qty 50

## 2014-07-18 MED ORDER — MEPERIDINE HCL 25 MG/ML IJ SOLN: 6.2500 mg | INTRAMUSCULAR | Status: DC | PRN

## 2014-07-18 MED ORDER — KETOROLAC TROMETHAMINE 30 MG/ML IJ SOLN: 30.0000 mg | Freq: Once | INTRAMUSCULAR | Status: DC

## 2014-07-18 SURGICAL SUPPLY — 40 items
BARRIER ADHS 3X4 INTERCEED (GAUZE/BANDAGES/DRESSINGS) IMPLANT
BRR ADH 4X3 ABS CNTRL BYND (GAUZE/BANDAGES/DRESSINGS)
CANISTER SUCT 3000ML (MISCELLANEOUS) ×3 IMPLANT
CLOTH BEACON ORANGE TIMEOUT ST (SAFETY) ×3 IMPLANT
CONT PATH 16OZ SNAP LID 3702 (MISCELLANEOUS) ×3 IMPLANT
DECANTER SPIKE VIAL GLASS SM (MISCELLANEOUS) IMPLANT
DRAPE WARM FLUID 44X44 (DRAPE) ×3 IMPLANT
DRSG OPSITE POSTOP 4X10 (GAUZE/BANDAGES/DRESSINGS) ×3 IMPLANT
DURAPREP 26ML APPLICATOR (WOUND CARE) ×3 IMPLANT
ELECT LIGASURE SHORT 9 REUSE (ELECTRODE) IMPLANT
GLOVE BIO SURGEON STRL SZ7 (GLOVE) ×6 IMPLANT
GLOVE BIOGEL PI IND STRL 7.0 (GLOVE) ×4 IMPLANT
GLOVE BIOGEL PI INDICATOR 7.0 (GLOVE) ×2
GOWN STRL REUS W/TWL LRG LVL3 (GOWN DISPOSABLE) ×9 IMPLANT
NDL HYPO 18GX1.5 BLUNT FILL (NEEDLE) IMPLANT
NEEDLE HYPO 18GX1.5 BLUNT FILL (NEEDLE) IMPLANT
NEEDLE HYPO 22GX1.5 SAFETY (NEEDLE) ×3 IMPLANT
NS IRRIG 1000ML POUR BTL (IV SOLUTION) ×3 IMPLANT
PACK ABDOMINAL GYN (CUSTOM PROCEDURE TRAY) ×3 IMPLANT
PAD OB MATERNITY 4.3X12.25 (PERSONAL CARE ITEMS) ×3 IMPLANT
PROTECTOR NERVE ULNAR (MISCELLANEOUS) ×3 IMPLANT
SPONGE LAP 18X18 X RAY DECT (DISPOSABLE) ×6 IMPLANT
STRIP CLOSURE SKIN 1/4X4 (GAUZE/BANDAGES/DRESSINGS) IMPLANT
SUT CHROMIC 3 0 SH 27 (SUTURE) IMPLANT
SUT MON AB 2-0 CT1 36 (SUTURE) ×3 IMPLANT
SUT MON AB 4-0 PS1 27 (SUTURE) ×3 IMPLANT
SUT PDS AB 0 CT1 27 (SUTURE) ×6 IMPLANT
SUT VIC AB 0 CT1 18XCR BRD8 (SUTURE) ×4 IMPLANT
SUT VIC AB 0 CT1 27 (SUTURE) ×3
SUT VIC AB 0 CT1 27XBRD ANBCTR (SUTURE) ×2 IMPLANT
SUT VIC AB 0 CT1 8-18 (SUTURE) ×6
SUT VIC AB 2-0 CT1 27 (SUTURE)
SUT VIC AB 2-0 CT1 TAPERPNT 27 (SUTURE) IMPLANT
SUT VIC AB 3-0 CT1 27 (SUTURE) ×3
SUT VIC AB 3-0 CT1 TAPERPNT 27 (SUTURE) ×2 IMPLANT
SUT VICRYL 0 TIES 12 18 (SUTURE) ×3 IMPLANT
SYR 20CC LL (SYRINGE) ×6 IMPLANT
TOWEL OR 17X24 6PK STRL BLUE (TOWEL DISPOSABLE) ×6 IMPLANT
TRAY FOLEY CATH SILVER 14FR (SET/KITS/TRAYS/PACK) ×3 IMPLANT
WATER STERILE IRR 1000ML POUR (IV SOLUTION) ×3 IMPLANT

## 2014-07-18 NOTE — Op Note (Signed)
`   Preoperative diagnosis: Symptomatic leiomyoma  Postoperative diagnosis: Same  Procedure: TAH, bilateral salpingectomy  Surgeon: Matthew Saras  Asst.: Low  EBL: 2 50 cc  Specimens removed: Uterus bilateral tubes to pathology  Procedure and findings:  The patient taken the operating room after an adequate level of general anesthesia was obtained with the patient supine the vagina prepped separately, Foley catheter position. The abdominal prep was carried out. Appropriate timeouts were taken at that point. Transverse incision was made excising the old scar this was carried down to the fascia which was incised and extended transversely. Rectus muscles divided in the midline, peritoneum entered superiorly without incident and extended in a vertical fashion. At this point the uterus was noted to be 12 week for 2 weeks size irregular with fibroids bilateral adnexa unremarkable. The uterus was elevated out of the incision long Kelly clamps placed at each utero-ovarian pedicle. Beginning on the left the round ligament was coagulated and divided with the LigaSure the peritoneum carried around anteriorly the exact same repeated on the opposite side the bladder was then blunt bluntly and sharply dissected below the cervical vaginal junction. The utero-ovarian pedicle on each side was isolated divided first free tie followed by suture ligature oh Vicryls. In sequential manner the ascending branch of the uterine artery was coagulated and divided with LigaSure, on further dissection the previously noted a rectovaginal surgery that she had for endometriotic nodule cause some scarring posteriorly. The posterior peritoneum was divided and with sharp and blunt dissection the scarring and rectum was cleared out of the way below the cervix. Continuing, the cardinal ligament, uterosacral ligament and cervical vaginal pedicles were clamped divided and suture ligated with oh Vicryls the specimen was thus removed. Vaginal cuff  closed with interrupted 0 Vicryls sutures. The pelvis is in irrigated with saline and aspirated all the operative sites were noted to be hemostatic. Reinspection of the area the rectovaginal septum was hemostatic and no evidence of any trauma to the bowel. Prior to closure sponge, needle, history counts reported as correct 2.  Peritoneum closed with a 20 Vicryls suture 20 Vicryls interrupted sutures used to reapproximate the rectus muscles in the midline. 0 PDS was then used to close the fascia transversely. The subcutaneous tissue was undermined to reduce tension this was made hemostatic with the Bovie. Diluted Exparel solution was injected into the fascial area and subcutaneous tissue for postoperative pain relief. 4-0 Monocryl subcutaneous for skin closure with a pressure dressing clear urine noted in the case.  Dictated with [place].

## 2014-07-18 NOTE — Anesthesia Postprocedure Evaluation (Signed)
  Anesthesia Post-op Note  Patient: Valerie Barnes  Procedure(s) Performed: Procedure(s): HYSTERECTOMY ABDOMINAL (N/A) BILATERAL SALPINGECTOMY (Bilateral)  Patient Location: [facility]  Anesthesia Type:General  Level of Consciousness: awake, alert  and oriented  Airway and Oxygen Therapy: Patient Spontanous Breathing and Patient connected to nasal cannula oxygen  Post-op Pain: none  Post-op Assessment: Post-op Vital signs reviewed, Patient's Cardiovascular Status Stable, Respiratory Function Stable, No signs of Nausea or vomiting and Adequate PO intake              Post-op Vital Signs: Reviewed and stable  Last Vitals:  Filed Vitals:   07/18/14 1343  BP: 114/64  Pulse: 88  Temp: 36.6 C  Resp: 12    Complications: No apparent anesthesia complications

## 2014-07-19 LAB — CBC
HCT: 27.4 % — ABNORMAL LOW (ref 36.0–46.0)
Hemoglobin: 9.3 g/dL — ABNORMAL LOW (ref 12.0–15.0)
MCH: 28.7 pg (ref 26.0–34.0)
MCHC: 33.9 g/dL (ref 30.0–36.0)
MCV: 84.6 fL (ref 78.0–100.0)
Platelets: 211 10*3/uL (ref 150–400)
RBC: 3.24 MIL/uL — ABNORMAL LOW (ref 3.87–5.11)
RDW: 13.2 % (ref 11.5–15.5)
WBC: 7.1 10*3/uL (ref 4.0–10.5)

## 2014-07-19 MED ORDER — FAMOTIDINE 20 MG PO TABS
20.0000 mg | ORAL_TABLET | Freq: Two times a day (BID) | ORAL | Status: DC
  Administered 2014-07-19 (×2): 20 mg via ORAL
  Filled 2014-07-19 (×2): qty 1

## 2014-07-19 NOTE — Progress Notes (Signed)
1 Day Post-Op Procedure(s) (LRB): HYSTERECTOMY ABDOMINAL (N/A) BILATERAL SALPINGECTOMY (Bilateral)  Subjective: Patient reports tolerating PO.    Objective: I have reviewed patient's vital signs, medications and labs. BP 110/60 mmHg  Pulse 65  Temp(Src) 98.9 F (37.2 C) (Oral)  Resp 15  Ht 5\' 5"  (1.651 m)  Wt 138 lb (62.596 kg)  BMI 22.96 kg/m2  SpO2 100% CBC    Component Value Date/Time   WBC 7.1 07/19/2014 0523   RBC 3.24* 07/19/2014 0523   HGB 9.3* 07/19/2014 0523   HCT 27.4* 07/19/2014 0523   PLT 211 07/19/2014 0523   MCV 84.6 07/19/2014 0523   MCH 28.7 07/19/2014 0523   MCHC 33.9 07/19/2014 0523   RDW 13.2 07/19/2014 0523     abd + BS, bandage dry  Assessment: s/p Procedure(s): HYSTERECTOMY ABDOMINAL (N/A) BILATERAL SALPINGECTOMY (Bilateral): stable  Plan: Advance diet Encourage ambulation Advance to PO medication  LOS: 1 day    [facility] M 07/19/2014, 7:33 AM

## 2014-07-20 MED ORDER — OXYCODONE-ACETAMINOPHEN 5-325 MG PO TABS: 1.0000 | ORAL_TABLET | ORAL | Status: AC | PRN

## 2014-07-20 MED ORDER — IBUPROFEN 800 MG PO TABS: 800.0000 mg | ORAL_TABLET | Freq: Three times a day (TID) | ORAL | Status: AC | PRN

## 2014-07-20 NOTE — Discharge Summary (Signed)
Physician Discharge Summary  Patient ID: Valerie Barnes MRN: [id number] DOB/AGE: Dec 09, 1970 44 y.o.  Admit date: 07/18/2014 Discharge date: 07/20/2014  Admission [facility]  Discharge Diagnoses: same Active Problems:   Leiomyoma   Discharged Condition: good  Hospital Course: adm for TAH due to symptomatic fibroids, diet advanced on POD 1, by POD 2, afeb, ambulating, tol PO  Consults: None  Significant Diagnostic Studies: labs:  CBC    Component Value Date/Time   WBC 7.1 07/19/2014 0523   RBC 3.24* 07/19/2014 0523   HGB 9.3* 07/19/2014 0523   HCT 27.4* 07/19/2014 0523   PLT 211 07/19/2014 0523   MCV 84.6 07/19/2014 0523   MCH 28.7 07/19/2014 0523   MCHC 33.9 07/19/2014 0523   RDW 13.2 07/19/2014 0523      Treatments: surgery: TAH  Discharge Exam: Blood pressure 97/41, pulse 76, temperature 98.4 F (36.9 C), temperature source Oral, resp. rate 16, height 5\' 5"  (1.651 m), weight 138 lb (62.596 kg), SpO2 100 %. General appearance: alert Incision/Wound:bandage C/D  Disposition: 01-Home or Self Care     Medication List    TAKE these medications        fexofenadine 180 MG tablet  Commonly known as:  ALLEGRA  Take 180 mg by mouth daily.     ibuprofen 800 MG tablet  Commonly known as:  ADVIL,MOTRIN  Take 1 tablet (800 mg total) by mouth every 8 (eight) hours as needed for moderate pain (mild pain).     oxyCODONE-acetaminophen 5-325 MG per tablet  Commonly known as:  PERCOCET/ROXICET  Take 1-2 tablets by mouth every 4 (four) hours as needed for severe pain (moderate to severe pain (when tolerating fluids)).     WELLBUTRIN SR 150 MG 12 hr tablet  Generic drug:  buPROPion  Take 150 mg by mouth daily.           Follow-up Information    Follow up with Margarette Asal, MD. Schedule an appointment as soon as possible for a visit in 10 days.   Specialty:  Obstetrics and Gynecology   Contact information:   [address]  [place] [address] [phone]       Signed: Margarette Asal 07/20/2014, 6:57 AM

## 2014-07-20 NOTE — Progress Notes (Signed)
Discharge instructions reviewed with patient.  Patient states understanding of home care, medications, activity, signs/symptoms to report to MD and return MD office visit.  Patients significant other and family will assist with her care @ home.  No home  equipment needed, patient has prescriptions and all personal belongings.  Patient  Discharged per wheelchair in stable condition with staff without incident.

## 2014-07-25 ENCOUNTER — Encounter (HOSPITAL_COMMUNITY): Payer: Self-pay | Admitting: Obstetrics and Gynecology

## 2014-10-14 ENCOUNTER — Other Ambulatory Visit: Payer: Self-pay | Admitting: Gynecology

## 2014-10-15 LAB — CYTOLOGY - PAP

## 2015-12-05 ENCOUNTER — Other Ambulatory Visit (HOSPITAL_COMMUNITY): Payer: Self-pay | Admitting: Physician Assistant

## 2015-12-05 DIAGNOSIS — R05 Cough: Secondary | ICD-10-CM

## 2015-12-05 DIAGNOSIS — R1013 Epigastric pain: Secondary | ICD-10-CM

## 2015-12-05 DIAGNOSIS — R053 Chronic cough: Secondary | ICD-10-CM

## 2015-12-05 DIAGNOSIS — K219 Gastro-esophageal reflux disease without esophagitis: Secondary | ICD-10-CM

## 2015-12-10 ENCOUNTER — Other Ambulatory Visit (HOSPITAL_COMMUNITY): Payer: Self-pay | Admitting: Physician Assistant

## 2015-12-10 ENCOUNTER — Ambulatory Visit (HOSPITAL_COMMUNITY)
Admission: RE | Admit: 2015-12-10 | Discharge: 2015-12-10 | Disposition: A | Discharge status: Home / Self Care | Payer: BLUE CROSS/BLUE SHIELD | Source: Ambulatory Visit | Attending: Physician Assistant | Admitting: Physician Assistant

## 2015-12-10 DIAGNOSIS — R05 Cough: Secondary | ICD-10-CM | POA: Diagnosis present

## 2015-12-10 DIAGNOSIS — R1013 Epigastric pain: Secondary | ICD-10-CM | POA: Diagnosis present

## 2015-12-10 DIAGNOSIS — R053 Chronic cough: Secondary | ICD-10-CM

## 2015-12-10 DIAGNOSIS — K219 Gastro-esophageal reflux disease without esophagitis: Secondary | ICD-10-CM | POA: Insufficient documentation

## 2015-12-16 ENCOUNTER — Other Ambulatory Visit: Payer: Self-pay | Admitting: Obstetrics and Gynecology

## 2015-12-16 DIAGNOSIS — R928 Other abnormal and inconclusive findings on diagnostic imaging of breast: Secondary | ICD-10-CM

## 2015-12-17 ENCOUNTER — Ambulatory Visit
Admission: RE | Admit: 2015-12-17 | Discharge: 2015-12-17 | Disposition: A | Discharge status: Home / Self Care | Payer: BLUE CROSS/BLUE SHIELD | Source: Ambulatory Visit | Attending: Obstetrics and Gynecology | Admitting: Obstetrics and Gynecology

## 2015-12-17 DIAGNOSIS — R928 Other abnormal and inconclusive findings on diagnostic imaging of breast: Secondary | ICD-10-CM

## 2015-12-24 ENCOUNTER — Other Ambulatory Visit: Payer: BLUE CROSS/BLUE SHIELD

## 2016-07-07 ENCOUNTER — Other Ambulatory Visit: Payer: Self-pay | Admitting: Gastroenterology

## 2016-07-07 DIAGNOSIS — R933 Abnormal findings on diagnostic imaging of other parts of digestive tract: Secondary | ICD-10-CM

## 2016-07-13 ENCOUNTER — Ambulatory Visit
Admission: RE | Admit: 2016-07-13 | Discharge: 2016-07-13 | Disposition: A | Discharge status: Home / Self Care | Payer: BLUE CROSS/BLUE SHIELD | Source: Ambulatory Visit | Attending: Gastroenterology | Admitting: Gastroenterology

## 2016-07-13 DIAGNOSIS — R933 Abnormal findings on diagnostic imaging of other parts of digestive tract: Secondary | ICD-10-CM

## 2016-07-13 MED ORDER — IOPAMIDOL (ISOVUE-300) INJECTION 61%
100.0000 mL | Freq: Once | INTRAVENOUS | Status: AC | PRN
  Administered 2016-07-13: 100 mL via INTRAVENOUS

## 2018-02-08 ENCOUNTER — Other Ambulatory Visit: Payer: Self-pay | Admitting: Gastroenterology

## 2018-03-15 ENCOUNTER — Ambulatory Visit (HOSPITAL_COMMUNITY): Admit: 2018-03-15 | Payer: BLUE CROSS/BLUE SHIELD | Admitting: Gastroenterology

## 2018-03-15 ENCOUNTER — Encounter (HOSPITAL_COMMUNITY): Payer: Self-pay

## 2018-03-15 SURGERY — MANOMETRY, ESOPHAGUS

## 2018-11-03 IMAGING — RF DG UGI W/ HIGH DENSITY W/KUB
12 of 16 series · 15 of 22 positions shown · non-contrast
Comparison: None.

CLINICAL DATA: Gastroesophageal reflux.  Chronic cough.

EXAM:
UPPER GI SERIES WITH KUB
TECHNIQUE: After obtaining a scout radiograph a routine upper GI series was
performed using thin and high density barium.
FLUOROSCOPY TIME:  Fluoroscopy Time:  1.6 minutes
Radiation Exposure Index (if provided by the fluoroscopic device):
UH.XmGy air kerma
Number of Acquired Spot Images: 6, the rest saved fluoroscopy

[Series 1: t abdomen supine · 0.15mm/px · 1 of 1 slices shown]
[im 1/1]
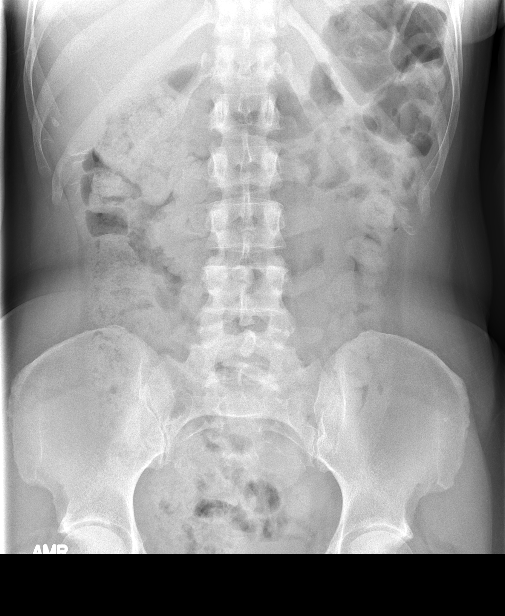

[Series 2: cp_standard · 0.17mm/px · 2 of 26 frames shown (1 of 8)]
[frame 14/26]
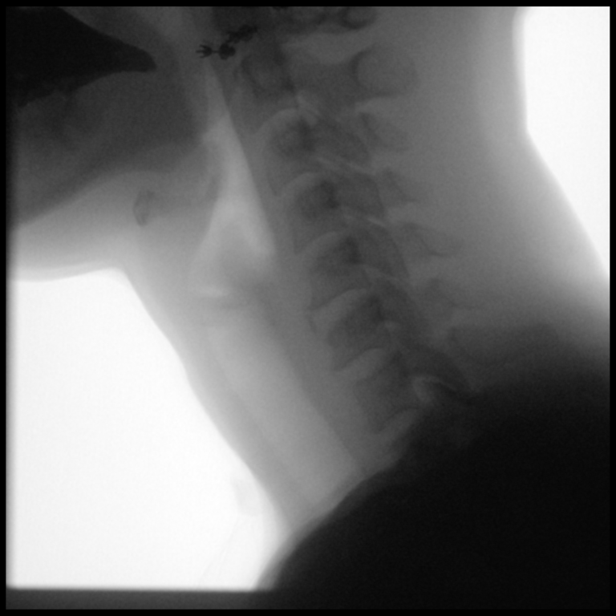
[frame 16/26]
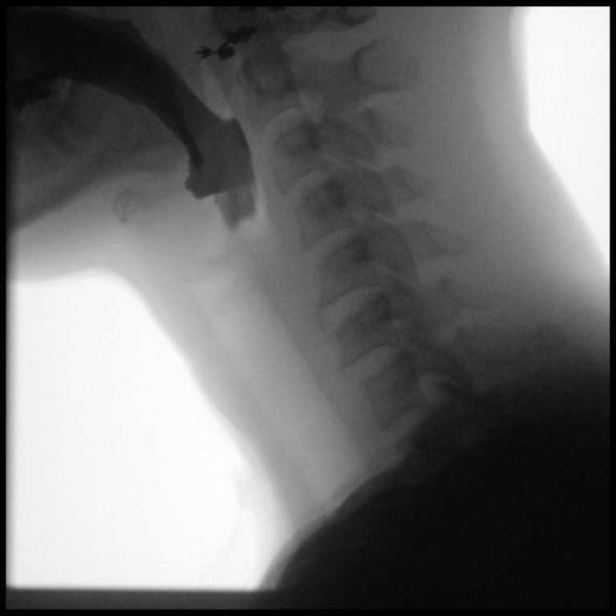

[Series 3: cp_standard · 0.17mm/px · 1 of 1 slices shown (2 of 8)]
[im 1/1]
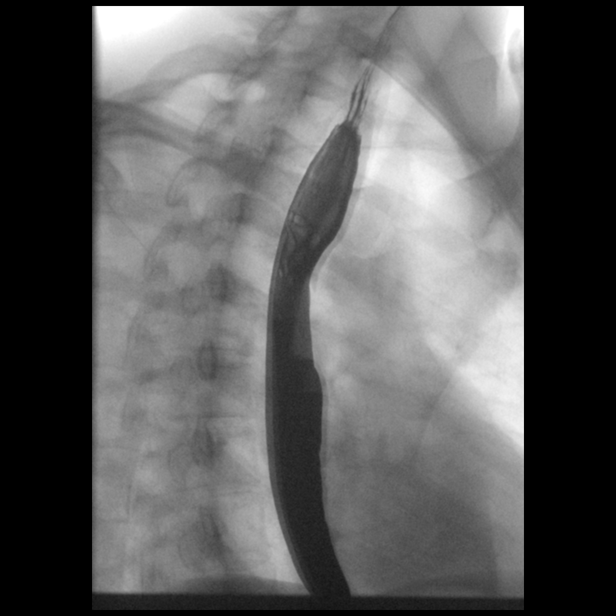

[Series 4: cp_standard · 0.17mm/px · 1 of 1 slices shown (3 of 8)]
[im 1/1]
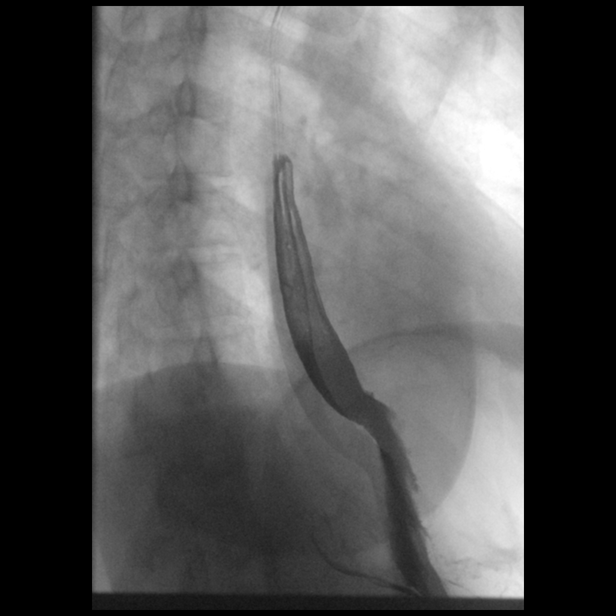

[Series 6: cp_standard · 0.17mm/px · 1 of 1 slices shown (4 of 8)]
[im 1/1]
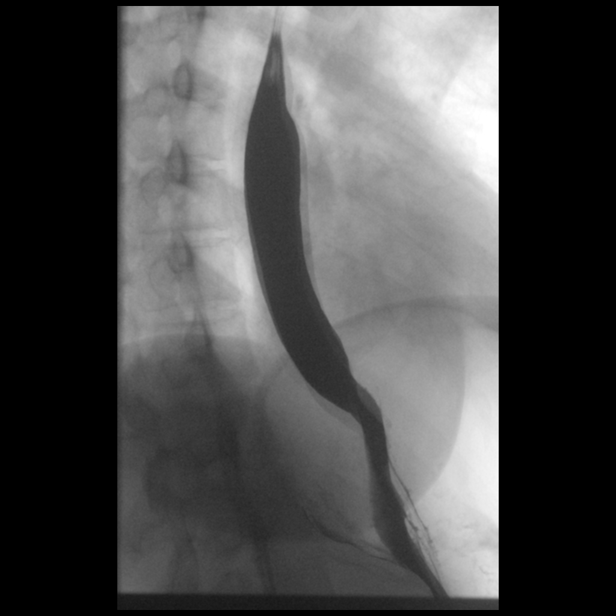

[Series 7: cp_standard · 0.17mm/px · 1 of 1 slices shown (5 of 8)]
[im 1/1]
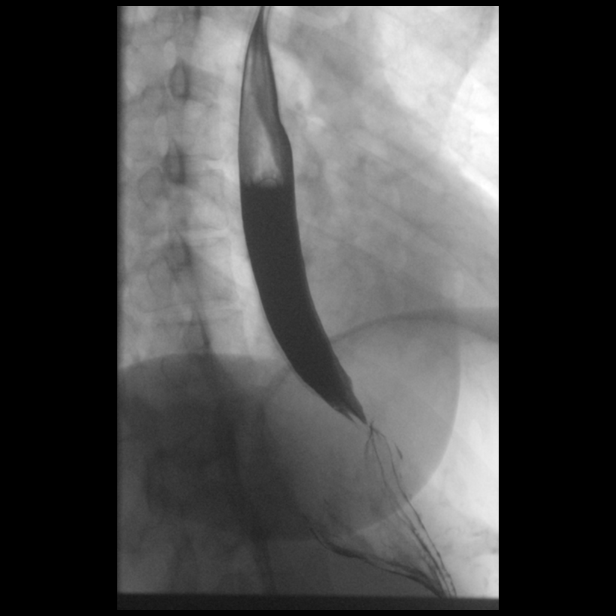

[Series 9: fluoro_barium 2fps_bw · 0.18mm/px · 2 of 2 frames shown (1 of 3)]
[frame 1/2]
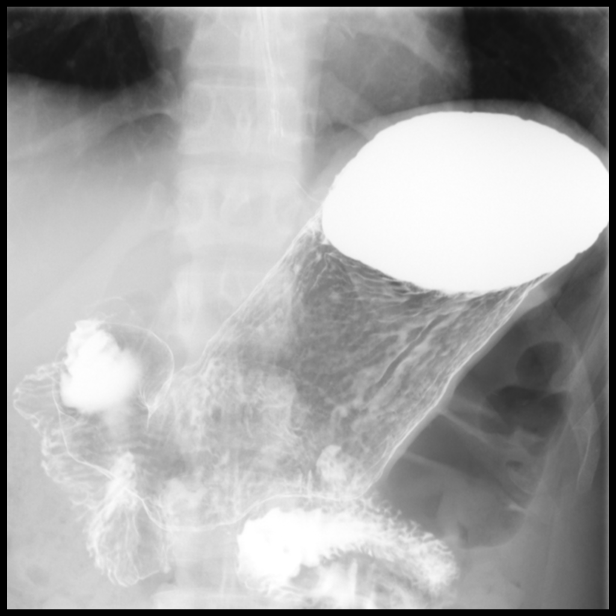
[frame 2/2]
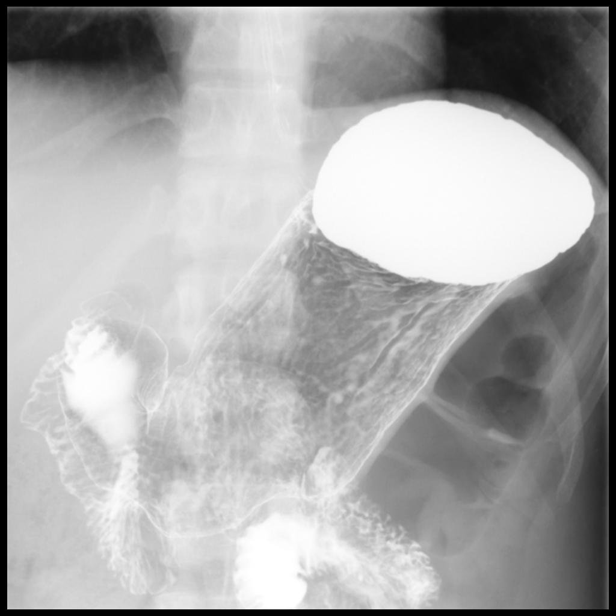

[Series 10: fluoro_barium 2fps_bw · 0.18mm/px · 1 of 1 slices shown (2 of 3)]
[im 1/1]
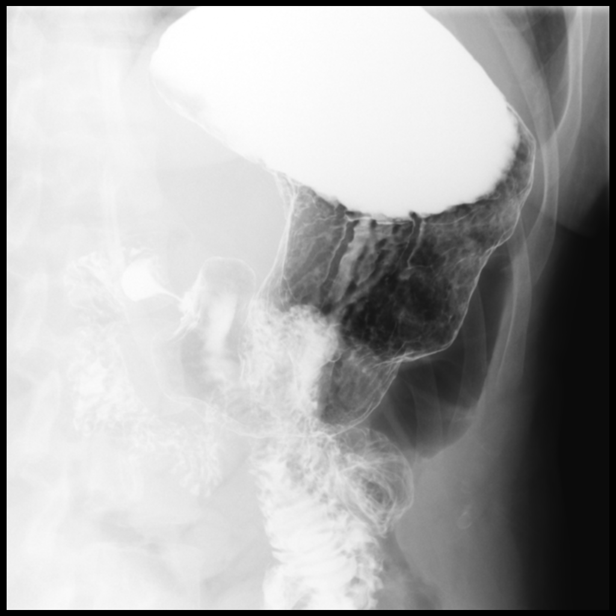

[Series 12: cp_standard · 0.17mm/px · 1 of 1 slices shown (6 of 8)]
[im 1/1]
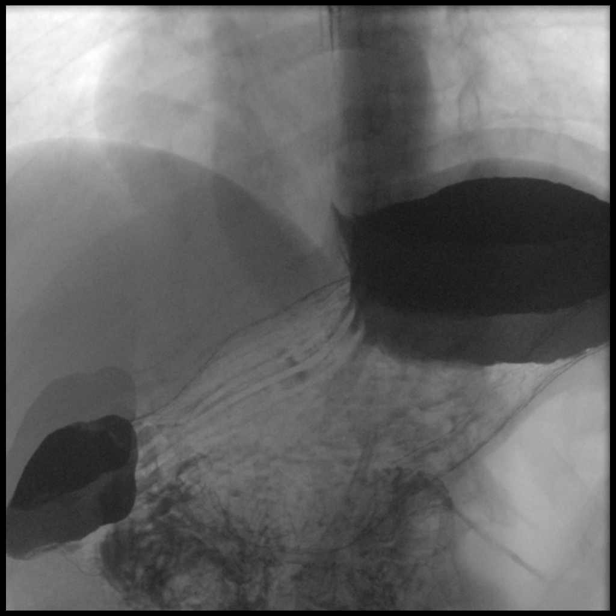

[Series 13: cp_standard · 0.17mm/px · 1 of 1 slices shown (7 of 8)]
[im 1/1]
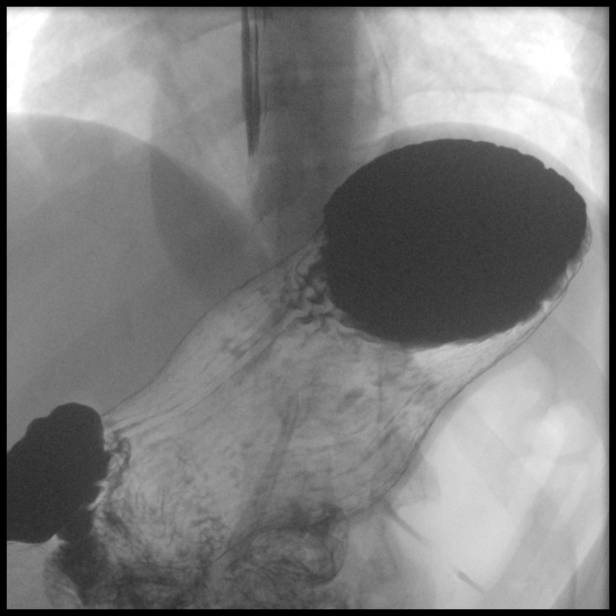

[Series 15: cp_standard · 0.18mm/px · 2 of 20 frames shown (8 of 8)]
[frame 4/20]
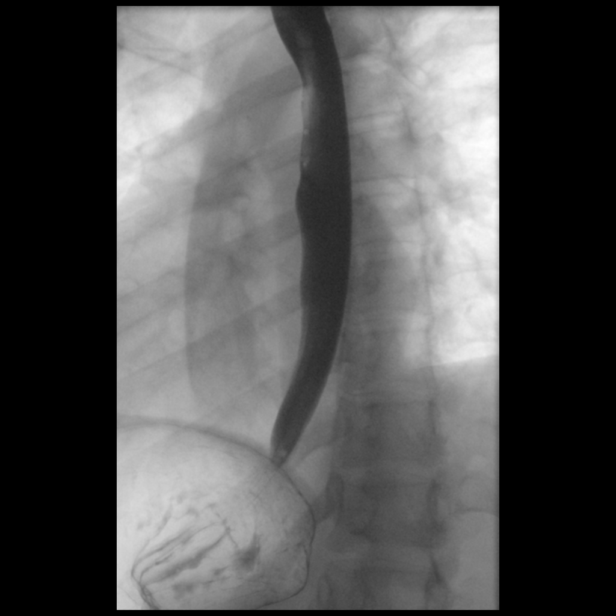
[frame 11/20]
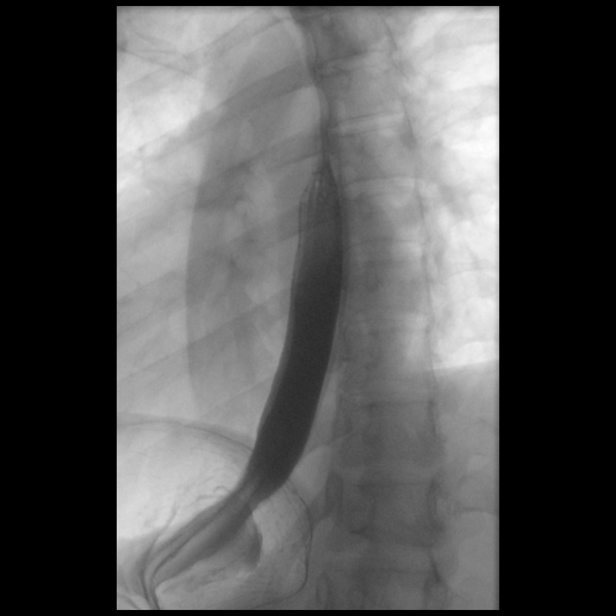

[Series 16: fluoro_barium 2fps_bw · 0.18mm/px · 1 of 1 slices shown (3 of 3)]
[im 1/1]
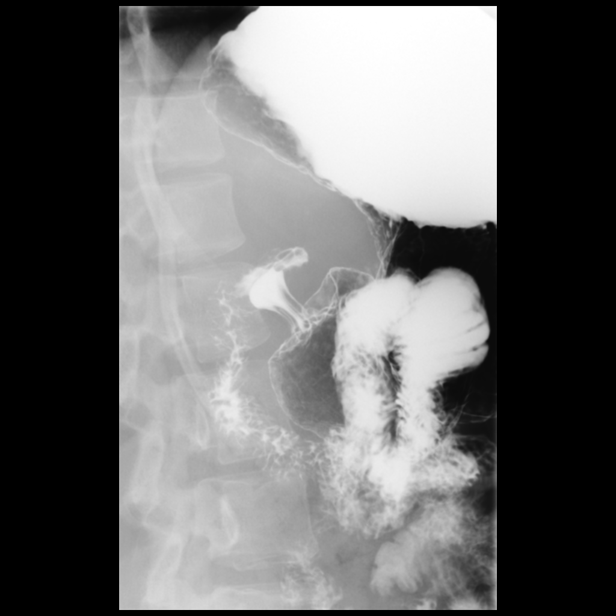

[15 of 22 positions shown; findings below may reference images not displayed]

FINDINGS: Scout image shows moderate stool volume. No obstruction or
unexpected calcification.

Lateral pharyngeal imaging during swallow was normal. No aspiration,
laryngeal penetration, or obstructive process.

The esophagus has normal distensibility and mucosal appearance. No
stricture, hiatal hernia, or reflux.

Normal distensibility of the stomach. No fold thickening or
ulceration noted. No gastric outlet obstruction. Normal duodenal
C-loop.
IMPRESSION: Normal UGI.  No explanation for cough.

## 2019-02-09 ENCOUNTER — Other Ambulatory Visit: Payer: Self-pay

## 2019-02-09 ENCOUNTER — Ambulatory Visit: Payer: BLUE CROSS/BLUE SHIELD | Attending: Internal Medicine

## 2019-02-09 DIAGNOSIS — Z20822 Contact with and (suspected) exposure to covid-19: Secondary | ICD-10-CM

## 2019-02-10 ENCOUNTER — Telehealth: Payer: Self-pay | Admitting: Internal Medicine

## 2019-02-10 LAB — NOVEL CORONAVIRUS, NAA: SARS-CoV-2, NAA: DETECTED — AB

## 2019-02-10 NOTE — Telephone Encounter (Signed)
Patient received her covid test result on My Chart and would like a call back with some instructions on what to do. Please call Ph# [phone]

## 2019-02-12 NOTE — Telephone Encounter (Signed)
Contacted pt; she states she has reviewed her MyChart Message; she has also consulted her PCP; will notify the [facility] Dept. because the pt has moved.

## 2019-08-27 NOTE — Progress Notes (Signed)
[facility] Note  08/28/2019     CHIEF COMPLAINT Patient presents for Retina Evaluation   HISTORY OF PRESENT ILLNESS: Valerie Barnes is a 49 y.o. female who presents to the clinic today for:   HPI    Retina Evaluation    In right eye.  This started 1 week ago.  Duration of 1 week.  Associated Symptoms Floaters.  Context:  distance vision and near vision.  I, the attending physician,  performed the HPI with the patient and updated documentation appropriately.          Comments    NP retina eval. Patient states her right eye has always been weaker eye of the two, but otherwise feels like her vision is fine.  Pt denies eye pain or discomfort OU.  Patient has Hx of floaters OU.  Patient denies flashes of light.       Last edited by Bernarda Caffey, MD on 08/28/2019 12:28 PM. (History)    pt is here on the referral of Dr. Rosana Hoes for concern of ERM OD, pt states she saw Dr. Rosana Hoes for routine exam only and was not having any problems with her vision, pt denies being diabetic or hypertensive  Referring physician: Leticia Clas, [address] [place],  [place] [address]  HISTORICAL INFORMATION:   Selected notes from the MEDICAL RECORD NUMBER Referred by Dr. Rosana Hoes for ret eval OU    CURRENT MEDICATIONS: No current outpatient medications on file. (Ophthalmic Drugs)   No current facility-administered medications for this visit. (Ophthalmic Drugs)   Current Outpatient Medications (Other)  Medication Sig  . cetirizine (ZYRTEC) 10 MG chewable tablet Chew 10 mg by mouth daily.  Marland Kitchen doxycycline (ADOXA) 100 MG tablet Take 100 mg by mouth 2 (two) times daily.  Marland Kitchen buPROPion (WELLBUTRIN SR) 150 MG 12 hr tablet Take 150 mg by mouth daily.  . fexofenadine (ALLEGRA) 180 MG tablet Take 180 mg by mouth daily.  Marland Kitchen ibuprofen (ADVIL,MOTRIN) 800 MG tablet Take 1 tablet (800 mg total) by mouth every 8 (eight) hours as needed for moderate pain (mild pain). (Patient not  taking: Reported on 08/28/2019)  . oxyCODONE-acetaminophen (PERCOCET/ROXICET) 5-325 MG per tablet Take 1-2 tablets by mouth every 4 (four) hours as needed for severe pain (moderate to severe pain (when tolerating fluids)).   No current facility-administered medications for this visit. (Other)   Facility-Administered Medications Ordered in Other Visits (Other)  Medication Route  . dexamethasone (DECADRON) injection   . fentaNYL (SUBLIMAZE) injection   . lidocaine (cardiac) 100 mg/99m (XYLOCAINE) 20 MG/ML injection 2%   . midazolam (VERSED) injection   . ondansetron (ZOFRAN) injection    REVIEW OF SYSTEMS: ROS    Positive for: Eyes   Negative for: Constitutional, Gastrointestinal, Neurological, Skin, Genitourinary, Musculoskeletal, HENT, Endocrine, Cardiovascular, Respiratory, Psychiatric, Allergic/Imm, Heme/Lymph   Last edited by EDoneen Poissonon 08/28/2019  8:42 AM. (History)     ALLERGIES Allergies  Allergen Reactions  . Codeine Hives  . Morphine And Related Hives    Has tolerated hydromorphone in the past   PAST MEDICAL HISTORY Past Medical History:  Diagnosis Date  . Anemia    DUB  . Depression   . GERD (gastroesophageal reflux disease)    Past Surgical History:  Procedure Laterality Date  . ABDOMINAL HYSTERECTOMY N/A 07/18/2014   Procedure: HYSTERECTOMY ABDOMINAL;  Surgeon: RMolli Posey MD;  [facility];  Service: Gynecology;  Laterality: N/A;  . BILATERAL  SALPINGECTOMY Bilateral 07/18/2014   Procedure: BILATERAL SALPINGECTOMY;  Surgeon: Molli Posey, MD;  Location: [facility];  Service: Gynecology;  Laterality: Bilateral;  . fibrois embolisation    . LAPAROTOMY  2002   endometrial mass removed  . UTERINE FIBROID EMBOLIZATION     FAMILY HISTORY History reviewed. No pertinent family history.  SOCIAL HISTORY Social History   Tobacco Use  . Smoking status: Never Smoker  . Smokeless tobacco: Never Used  Substance Use Topics  . Alcohol use: Yes     Comment: occassional   . Drug use: No         OPHTHALMIC EXAM:  Base Eye Exam    Visual Acuity (Snellen - Linear)      Right Left   Dist cc 20/40 +2 20/25 -2   Dist ph cc 20/25 -1 NI       Tonometry (Tonopen, 9:00 AM)      Right Left   Pressure 15 14       Pupils      Dark Light Shape React APD   Right 4 3 Round Brisk 0   Left 4 3 Round Brisk 0       Visual Fields      Left Right    Full Full       Extraocular Movement      Right Left    Full Full       Neuro/Psych    Oriented x3: Yes   Mood/Affect: Normal       Dilation    Both eyes: 1.0% Mydriacyl, 2.5% Phenylephrine @ 9:00 AM        Slit Lamp and Fundus Exam    Slit Lamp Exam      Right Left   Lids/Lashes Normal Normal   Conjunctiva/Sclera Mild Melanosis Mild Melanosis   Cornea Trace Debris in tear film, 3+ Guttata 1+ Punctate epithelial erosions, 3+ Guttata   Anterior Chamber Deep and quiet Deep and quiet   Iris Round and dilated Round and dilated   Lens 1+ Cortical cataract 1+ Cortical cataract   Vitreous Vitreous syneresis, vitreous condensations Vitreous syneresis       Fundus Exam      Right Left   Disc Pink and Sharp, Compact Pink and Sharp, Compact   C/D Ratio 0.3 0.4   Macula Flat, Good foveal reflex, ERM greatest superiorly with mild retinal thickening, dot hemes just inside ST arcades Flat, Good foveal reflex, No heme or edema   Vessels Vascular attenuation, mild tortuousity Vascular attenuation, mild tortuousity   Periphery Attached, No heme, No RT/RD Attached           Refraction    Wearing Rx      Sphere Cylinder Axis   Right -7.25 +1.25 098   Left -9.00 +2.25 108       Manifest Refraction      Sphere Cylinder Axis Dist VA   Right -7.50 +1.25 100 20/30-1   Left -8.00 +2.25 108          IMAGING AND PROCEDURES  Imaging and Procedures for 08/28/2019  OCT, Retina - OU - Both Eyes       Right Eye Quality was good. Central Foveal Thickness: 297. Progression has no  prior data. Findings include normal foveal contour, no IRF, no SRF, epiretinal membrane, macular pucker (ERM superior macula with pucker and retinal thickening, partial PVD).   Left Eye Quality was good. Central Foveal Thickness: 246. Progression has no prior data. Findings include normal  foveal contour, no IRF, no SRF (Trace ERM; partial PVD).   Notes *Images captured and stored on drive  Diagnosis / Impression:  OD: ERM superior macula with pucker and retinal thickening OS: NFP, no IRF/SRF  Clinical management:  See below  Abbreviations: NFP - Normal foveal profile. CME - cystoid macular edema. PED - pigment epithelial detachment. IRF - intraretinal fluid. SRF - subretinal fluid. EZ - ellipsoid zone. ERM - epiretinal membrane. ORA - outer retinal atrophy. ORT - outer retinal tubulation. SRHM - subretinal hyper-reflective material. IRHM - intraretinal hyper-reflective material                 ASSESSMENT/PLAN:    ICD-10-CM   1. Epiretinal membrane (ERM) of right eye  H35.371   2. Retinal edema  H35.81 OCT, Retina - OU - Both Eyes  3. Retinal hemorrhage of right eye  H35.61   4. 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 Epiretinal membrane, OD - The natural history, anatomy, potential for loss of vision, and treatment options including vitrectomy techniques and the complications of endophthalmitis, retinal detachment, vitreous hemorrhage, cataract progression and permanent vision loss discussed with the patient. - mild ERM - BCVA 20/25 - asymptomatic, no metamorphopsia - no indication for surgery at this time - monitor for now - f/u 3 mos -- DFE/OCT/FA (transit OD)  3. Retinal hemorrhage OD  - punctate dot hemes superior macula within bed of ERM  - unclear etiology-- no history of HTN or DM  - no visual significance  - recommend monitoring and FA next visit  4. Cortical Cataract OU - The symptoms of cataract, surgical options, and  treatments and risks were discussed with patient. - discussed diagnosis and progression - not yet visually significant - monitor for now  5. Corneal Guttata OU  - no corneal edema  Ophthalmic Meds Ordered this visit:  No orders of the defined types were placed in this encounter.     Return in about 3 months (around 11/28/2019) for f/u ERM OD, DFE, OCT, Fluorescein Angiogram.  There are no Patient Instructions on file for this visit.   Explained the diagnoses, plan, and follow up with the patient and they expressed understanding.  Patient expressed understanding of the importance of proper follow up care.  This document serves as a record of services personally performed by Gardiner Sleeper, MD, PhD. It was created on their behalf by Estill Bakes, COT an ophthalmic technician. The creation of this record is the provider's dictation and/or activities during the visit.    Electronically signed by: Estill Bakes, COT 8.2.21 @ 12:31 PM   This document serves as a record of services personally performed by Gardiner Sleeper, MD, PhD. It was created on their behalf by San Jetty. Owens Shark, OA an ophthalmic technician. The creation of this record is the provider's dictation and/or activities during the visit.    Electronically signed by: San Jetty. Marguerita Merles 08.03.2021 12:31 PM  Gardiner Sleeper, M.D., Ph.D. Diseases & Surgery of the Retina and Vitreous [facility]  I have reviewed the above documentation for accuracy and completeness, and I agree with the above. Gardiner Sleeper, M.D., Ph.D. 08/28/19 12:31 PM   Abbreviations: M myopia (nearsighted); A astigmatism; H hyperopia (farsighted); P presbyopia; Mrx spectacle prescription;  CTL contact lenses; OD right eye; OS left eye; OU both eyes  XT exotropia; ET esotropia; PEK punctate epithelial keratitis;  PEE punctate epithelial erosions; DES dry eye syndrome; MGD meibomian gland dysfunction; ATs artificial tears; PFAT's  preservative free artificial tears; [place] nuclear sclerotic cataract; PSC posterior subcapsular cataract; ERM epi-retinal membrane; PVD posterior vitreous detachment; RD retinal detachment; DM diabetes mellitus; DR diabetic retinopathy; NPDR non-proliferative diabetic retinopathy; PDR proliferative diabetic retinopathy; CSME clinically significant macular edema; DME diabetic macular edema; dbh dot blot hemorrhages; CWS cotton wool spot; POAG primary open angle glaucoma; C/D cup-to-disc ratio; HVF humphrey visual field; GVF goldmann visual field; OCT optical coherence tomography; IOP intraocular pressure; BRVO Branch retinal vein occlusion; CRVO central retinal vein occlusion; CRAO central retinal artery occlusion; BRAO branch retinal artery occlusion; RT retinal tear; SB scleral buckle; PPV pars plana vitrectomy; VH Vitreous hemorrhage; PRP panretinal laser photocoagulation; IVK intravitreal kenalog; VMT vitreomacular traction; MH Macular hole;  NVD neovascularization of the disc; NVE neovascularization elsewhere; AREDS age related eye disease study; ARMD age related macular degeneration; POAG primary open angle glaucoma; EBMD epithelial/anterior basement membrane dystrophy; ACIOL anterior chamber intraocular lens; IOL intraocular lens; PCIOL posterior chamber intraocular lens; Phaco/IOL phacoemulsification with intraocular lens placement; [place] photorefractive keratectomy; LASIK laser assisted in situ keratomileusis; HTN hypertension; DM diabetes mellitus; COPD chronic obstructive pulmonary disease

## 2019-08-28 ENCOUNTER — Other Ambulatory Visit: Payer: Self-pay

## 2019-08-28 ENCOUNTER — Encounter (INDEPENDENT_AMBULATORY_CARE_PROVIDER_SITE_OTHER): Payer: Self-pay | Admitting: Ophthalmology

## 2019-08-28 ENCOUNTER — Ambulatory Visit (INDEPENDENT_AMBULATORY_CARE_PROVIDER_SITE_OTHER): Payer: BLUE CROSS/BLUE SHIELD | Admitting: Ophthalmology

## 2019-08-28 DIAGNOSIS — H269 Unspecified cataract: Secondary | ICD-10-CM

## 2019-08-28 DIAGNOSIS — H3581 Retinal edema: Secondary | ICD-10-CM | POA: Diagnosis not present

## 2019-08-28 DIAGNOSIS — H35371 Puckering of macula, right eye: Secondary | ICD-10-CM | POA: Diagnosis not present

## 2019-08-28 DIAGNOSIS — H3561 Retinal hemorrhage, right eye: Secondary | ICD-10-CM

## 2019-08-28 DIAGNOSIS — H18519 Endothelial corneal dystrophy, unspecified eye: Secondary | ICD-10-CM

## 2019-08-31 ENCOUNTER — Encounter (INDEPENDENT_AMBULATORY_CARE_PROVIDER_SITE_OTHER): Payer: Self-pay | Admitting: Ophthalmology

## 2019-11-22 NOTE — Progress Notes (Signed)
[facility] Note  11/27/2019     CHIEF COMPLAINT Patient presents for Retina Follow Up   HISTORY OF PRESENT ILLNESS: Valerie Barnes is a 49 y.o. female who presents to the clinic today for:   HPI    Retina Follow Up    Patient presents with  Other.  In right eye.  Duration of 3 months.  Since onset it is stable.  I, the attending physician,  performed the HPI with the patient and updated documentation appropriately.          Comments    3 month follow up ERM OD- Vision stable OU.  Denies any new problems or using eye drops.        Last edited by Bernarda Caffey, MD on 11/27/2019 11:19 AM. (History)    pt states vision is "about the same"  Referring physician: Glenda Chroman, MD [place],  [place] [address]  HISTORICAL INFORMATION:   Selected notes from the MEDICAL RECORD NUMBER Referred by Dr. Rosana Hoes for ret eval OU   CURRENT MEDICATIONS: No current outpatient medications on file. (Ophthalmic Drugs)   No current facility-administered medications for this visit. (Ophthalmic Drugs)   Current Outpatient Medications (Other)  Medication Sig  . buPROPion (WELLBUTRIN SR) 150 MG 12 hr tablet Take 150 mg by mouth daily.  . cetirizine (ZYRTEC) 10 MG chewable tablet Chew 10 mg by mouth daily.  Marland Kitchen doxycycline (ADOXA) 100 MG tablet Take 100 mg by mouth 2 (two) times daily.  . fexofenadine (ALLEGRA) 180 MG tablet Take 180 mg by mouth daily.  Marland Kitchen ibuprofen (ADVIL,MOTRIN) 800 MG tablet Take 1 tablet (800 mg total) by mouth every 8 (eight) hours as needed for moderate pain (mild pain).  Marland Kitchen oxyCODONE-acetaminophen (PERCOCET/ROXICET) 5-325 MG per tablet Take 1-2 tablets by mouth every 4 (four) hours as needed for severe pain (moderate to severe pain (when tolerating fluids)).   No current facility-administered medications for this visit. (Other)   Facility-Administered Medications Ordered in Other Visits (Other)  Medication Route  . dexamethasone (DECADRON)  injection   . fentaNYL (SUBLIMAZE) injection   . lidocaine (cardiac) 100 mg/25m (XYLOCAINE) 20 MG/ML injection 2%   . midazolam (VERSED) injection   . ondansetron (ZOFRAN) injection    REVIEW OF SYSTEMS: ROS    Positive for: Eyes, Heme/Lymph   Negative for: Constitutional, Gastrointestinal, Neurological, Skin, Genitourinary, Musculoskeletal, HENT, Endocrine, Cardiovascular, Respiratory, Psychiatric, Allergic/Imm   Last edited by HLeonie Douglas COA on 11/27/2019 10:00 AM. (History)     ALLERGIES Allergies  Allergen Reactions  . Codeine Hives  . Morphine And Related Hives    Has tolerated hydromorphone in the past   PAST MEDICAL HISTORY Past Medical History:  Diagnosis Date  . Anemia    DUB  . Depression   . GERD (gastroesophageal reflux disease)    Past Surgical History:  Procedure Laterality Date  . ABDOMINAL HYSTERECTOMY N/A 07/18/2014   Procedure: HYSTERECTOMY ABDOMINAL;  Surgeon: RMolli Posey MD;  [facility];  Service: Gynecology;  Laterality: N/A;  . BILATERAL SALPINGECTOMY Bilateral 07/18/2014   Procedure: BILATERAL SALPINGECTOMY;  Surgeon: RMolli Posey MD;  [facility];  Service: Gynecology;  Laterality: Bilateral;  . fibrois embolisation    . LAPAROTOMY  2002   endometrial mass removed  . UTERINE FIBROID EMBOLIZATION     FAMILY HISTORY History reviewed. No pertinent family history.  SOCIAL HISTORY Social History   Tobacco Use  . Smoking status: Never Smoker  .  Smokeless tobacco: Never Used  Substance Use Topics  . Alcohol use: Yes    Comment: occassional   . Drug use: No         OPHTHALMIC EXAM:  Base Eye Exam    Visual Acuity (Snellen - Linear)      Right Left   Dist cc 20/25 +2 20/20 -2   Correction: Glasses       Tonometry (Tonopen, 10:04 AM)      Right Left   Pressure 15 16       Pupils      Dark Light Shape React APD   Right 4 3 Round Brisk None   Left 4 3 Round Brisk None       Visual Fields (Counting fingers)       Left Right    Full Full       Extraocular Movement      Right Left    Full Full       Neuro/Psych    Oriented x3: Yes   Mood/Affect: Normal       Dilation    Both eyes: 1.0% Mydriacyl, 2.5% Phenylephrine @ 10:04 AM        Slit Lamp and Fundus Exam    Slit Lamp Exam      Right Left   Lids/Lashes Normal Normal   Conjunctiva/Sclera Mild Melanosis Mild Melanosis   Cornea Trace Debris in tear film, 3+ Guttata 1+ Punctate epithelial erosions, 3+ Guttata   Anterior Chamber Deep and quiet Deep and quiet   Iris Round and dilated Round and dilated   Lens 1+ Cortical cataract 1+ Cortical cataract   Vitreous Vitreous syneresis, vitreous condensations Vitreous syneresis       Fundus Exam      Right Left   Disc Pink and Sharp, Compact Pink and Sharp, Compact   C/D Ratio 0.3 0.4   Macula Flat, Good foveal reflex, ERM greatest superiorly with mild retinal thickening, dot hemes just inside ST arcades - improved Flat, Good foveal reflex, RPE mottling and clumping, No heme or edema   Vessels Vascular attenuation, mild tortuousity Vascular attenuation, mild tortuousity   Periphery Attached, No heme, No RT/RD Attached           Refraction    Wearing Rx      Sphere Cylinder Axis   Right -7.25 +1.25 098   Left -9.00 +2.25 108         IMAGING AND PROCEDURES  Imaging and Procedures for 11/27/2019  OCT, Retina - OU - Both Eyes       Right Eye Quality was good. Central Foveal Thickness: 260. Progression has been stable. Findings include normal foveal contour, no IRF, no SRF, epiretinal membrane, macular pucker (ERM superior macula with pucker and retinal thickening, partial PVD).   Left Eye Quality was good. Central Foveal Thickness: 232. Progression has been stable. Findings include normal foveal contour, no IRF, no SRF (Trace ERM; partial PVD).   Notes *Images captured and stored on drive  Diagnosis / Impression:  OD: ERM superior macula with pucker and retinal  thickening -- stable from prior OS: NFP, no IRF/SRF  Clinical management:  See below  Abbreviations: NFP - Normal foveal profile. CME - cystoid macular edema. PED - pigment epithelial detachment. IRF - intraretinal fluid. SRF - subretinal fluid. EZ - ellipsoid zone. ERM - epiretinal membrane. ORA - outer retinal atrophy. ORT - outer retinal tubulation. SRHM - subretinal hyper-reflective material. IRHM - intraretinal hyper-reflective material  Fluorescein Angiography Optos (Transit OD)       Right Eye   Progression has no prior data. Early phase findings include microaneurysm (Straightening of superior vessels, increased tortuosity and telangiectasias superior to fovea). Mid/Late phase findings include microaneurysm (Focal MA superior macular -- No significant leakage).   Left Eye   Progression has no prior data. Early phase findings include normal observations. Mid/Late phase findings include normal observations.   Notes **Images stored on drive**  Impression: OD: Focal MA superior macular -- No significant leakage OS: normal study                  ASSESSMENT/PLAN:    ICD-10-CM   1. Epiretinal membrane (ERM) of right eye  H35.371   2. Retinal edema  H35.81 OCT, Retina - OU - Both Eyes  3. Retinal hemorrhage of right eye  H35.61 Fluorescein Angiography Optos (Transit OD)  4. Cortical cataract of both eyes  H26.9   5. 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 Hypertensive retinopathy of both eyes  H35.033 Fluorescein Angiography Optos (Transit OD)    1,2. Epiretinal membrane, OD - ERM with retina thickening, superior macula, extrafoveal - BCVA 20/25 - asymptomatic, no metamorphopsia - no indication for surgery at this time - monitor for now - f/u 4 mos -- DFE/OCT/discussion of FA  3. Retinal hemorrhage OD -- improving  - punctate dot hemes superior macula within bed of ERM -- improved today  - unclear etiology-- no history of HTN or DM; +history of  anemia  - no visual significance  - FA 11.2.21 shows focal MA superior macula, no significant leakage  - monitor  4. Cortical Cataract OU - The symptoms of cataract, surgical options, and treatments and risks were discussed with patient. - discussed diagnosis and progression - not yet visually significant - monitor for now  5. Corneal Guttata OU  - no corneal edema  Ophthalmic Meds Ordered this visit:  No orders of the defined types were placed in this encounter.     Return in about 4 months (around 03/26/2020) for f/u ERM OD, DFE, OCT.  There are no Patient Instructions on file for this visit.   Explained the diagnoses, plan, and follow up with the patient and they expressed understanding.  Patient expressed understanding of the importance of proper follow up care.  This document serves as a record of services personally performed by Gardiner Sleeper, MD, PhD. It was created on their behalf by Estill Bakes, COT an ophthalmic technician. The creation of this record is the provider's dictation and/or activities during the visit.    Electronically signed by: Estill Bakes, COT 10.28.21 @ 12:36 AM   Gardiner Sleeper, M.D., Ph.D. Diseases & Surgery of the Retina and [place] 11/27/2019   I have reviewed the above documentation for accuracy and completeness, and I agree with the above. Gardiner Sleeper, M.D., Ph.D. 11/28/19 12:36 AM   Abbreviations: M myopia (nearsighted); A astigmatism; H hyperopia (farsighted); P presbyopia; Mrx spectacle prescription;  CTL contact lenses; OD right eye; OS left eye; OU both eyes  XT exotropia; ET esotropia; PEK punctate epithelial keratitis; PEE punctate epithelial erosions; DES dry eye syndrome; MGD meibomian gland dysfunction; ATs artificial tears; PFAT's preservative free artificial tears; [place] nuclear sclerotic cataract; PSC posterior subcapsular cataract; ERM epi-retinal membrane; PVD posterior vitreous  detachment; RD retinal detachment; DM diabetes mellitus; DR diabetic retinopathy; NPDR non-proliferative diabetic retinopathy; PDR proliferative diabetic retinopathy; CSME  clinically significant macular edema; DME diabetic macular edema; dbh dot blot hemorrhages; CWS cotton wool spot; POAG primary open angle glaucoma; C/D cup-to-disc ratio; HVF humphrey visual field; GVF goldmann visual field; OCT optical coherence tomography; IOP intraocular pressure; BRVO Branch retinal vein occlusion; CRVO central retinal vein occlusion; CRAO central retinal artery occlusion; BRAO branch retinal artery occlusion; RT retinal tear; SB scleral buckle; PPV pars plana vitrectomy; VH Vitreous hemorrhage; PRP panretinal laser photocoagulation; IVK intravitreal kenalog; VMT vitreomacular traction; MH Macular hole;  NVD neovascularization of the disc; NVE neovascularization elsewhere; AREDS age related eye disease study; ARMD age related macular degeneration; POAG primary open angle glaucoma; EBMD epithelial/anterior basement membrane dystrophy; ACIOL anterior chamber intraocular lens; IOL intraocular lens; PCIOL posterior chamber intraocular lens; Phaco/IOL phacoemulsification with intraocular lens placement; [place] photorefractive keratectomy; LASIK laser assisted in situ keratomileusis; HTN hypertension; DM diabetes mellitus; COPD chronic obstructive pulmonary disease

## 2019-11-27 ENCOUNTER — Ambulatory Visit (INDEPENDENT_AMBULATORY_CARE_PROVIDER_SITE_OTHER): Payer: BLUE CROSS/BLUE SHIELD | Admitting: Ophthalmology

## 2019-11-27 ENCOUNTER — Other Ambulatory Visit: Payer: Self-pay

## 2019-11-27 ENCOUNTER — Encounter (INDEPENDENT_AMBULATORY_CARE_PROVIDER_SITE_OTHER): Payer: Self-pay | Admitting: Ophthalmology

## 2019-11-27 DIAGNOSIS — H35371 Puckering of macula, right eye: Secondary | ICD-10-CM | POA: Diagnosis not present

## 2019-11-27 DIAGNOSIS — H3561 Retinal hemorrhage, right eye: Secondary | ICD-10-CM | POA: Diagnosis not present

## 2019-11-27 DIAGNOSIS — H269 Unspecified cataract: Secondary | ICD-10-CM

## 2019-11-27 DIAGNOSIS — H18513 Endothelial corneal dystrophy, bilateral: Secondary | ICD-10-CM

## 2019-11-27 DIAGNOSIS — H35033 Hypertensive retinopathy, bilateral: Secondary | ICD-10-CM | POA: Diagnosis not present

## 2019-11-27 DIAGNOSIS — H3581 Retinal edema: Secondary | ICD-10-CM

## 2019-11-28 ENCOUNTER — Encounter (INDEPENDENT_AMBULATORY_CARE_PROVIDER_SITE_OTHER): Payer: BLUE CROSS/BLUE SHIELD | Admitting: Ophthalmology

## 2020-03-24 NOTE — Progress Notes (Addendum)
[facility] Note  03/26/2020     CHIEF COMPLAINT Patient presents for Retina Follow Up   HISTORY OF PRESENT ILLNESS: Valerie Barnes is a 50 y.o. female who presents to the clinic today for:   HPI    Retina Follow Up    Patient presents with  Other.  In right eye.  This started 4 months ago.  I, the attending physician,  performed the HPI with the patient and updated documentation appropriately.          Comments    Patient here for 4 months retina follow up for ERM OD. Patient states vision not great. Vision different. Close up getting blurry. Sensitive to light. Has to darken computer screen. Hard to see at night. No eye pain.       Last edited by Bernarda Caffey, MD on 03/26/2020 12:57 PM. (History)    pt states it is hard for her to see up close, she states distance vision seems okay, she states she is very light sensitive   Referring physician: Leticia Clas, [address] [place],  [place] [address]  HISTORICAL INFORMATION:   Selected notes from the MEDICAL RECORD NUMBER Referred by Dr. Rosana Hoes for ret eval OU   CURRENT MEDICATIONS: No current outpatient medications on file. (Ophthalmic Drugs)   No current facility-administered medications for this visit. (Ophthalmic Drugs)   Current Outpatient Medications (Other)  Medication Sig  . buPROPion (WELLBUTRIN SR) 150 MG 12 hr tablet Take 150 mg by mouth daily.  . cetirizine (ZYRTEC) 10 MG chewable tablet Chew 10 mg by mouth daily.  Marland Kitchen doxycycline (ADOXA) 100 MG tablet Take 100 mg by mouth 2 (two) times daily.  . fexofenadine (ALLEGRA) 180 MG tablet Take 180 mg by mouth daily.  Marland Kitchen ibuprofen (ADVIL,MOTRIN) 800 MG tablet Take 1 tablet (800 mg total) by mouth every 8 (eight) hours as needed for moderate pain (mild pain).  Marland Kitchen oxyCODONE-acetaminophen (PERCOCET/ROXICET) 5-325 MG per tablet Take 1-2 tablets by mouth every 4 (four) hours as needed for severe pain (moderate to severe pain (when  tolerating fluids)).   No current facility-administered medications for this visit. (Other)   Facility-Administered Medications Ordered in Other Visits (Other)  Medication Route  . dexamethasone (DECADRON) injection   . fentaNYL (SUBLIMAZE) injection   . lidocaine (cardiac) 100 mg/26ml (XYLOCAINE) 20 MG/ML injection 2%   . midazolam (VERSED) injection   . ondansetron (ZOFRAN) injection    REVIEW OF SYSTEMS: ROS    Positive for: Eyes, Heme/Lymph   Negative for: Constitutional, Gastrointestinal, Neurological, Skin, Genitourinary, Musculoskeletal, HENT, Endocrine, Cardiovascular, Respiratory, Psychiatric, Allergic/Imm   Last edited by Theodore Demark, COA on 03/26/2020  8:09 AM. (History)     ALLERGIES Allergies  Allergen Reactions  . Codeine Hives  . Morphine And Related Hives    Has tolerated hydromorphone in the past   PAST MEDICAL HISTORY Past Medical History:  Diagnosis Date  . Anemia    DUB  . Depression   . GERD (gastroesophageal reflux disease)    Past Surgical History:  Procedure Laterality Date  . ABDOMINAL HYSTERECTOMY N/A 07/18/2014   Procedure: HYSTERECTOMY ABDOMINAL;  Surgeon: Molli Posey, MD;  Location: [facility];  Service: Gynecology;  Laterality: N/A;  . BILATERAL SALPINGECTOMY Bilateral 07/18/2014   Procedure: BILATERAL SALPINGECTOMY;  Surgeon: Molli Posey, MD;  Location: [facility];  Service: Gynecology;  Laterality: Bilateral;  . fibrois embolisation    . LAPAROTOMY  2002  endometrial mass removed  . UTERINE FIBROID EMBOLIZATION     FAMILY HISTORY History reviewed. No pertinent family history.  SOCIAL HISTORY Social History   Tobacco Use  . Smoking status: Never Smoker  . Smokeless tobacco: Never Used  Substance Use Topics  . Alcohol use: Yes    Comment: occassional   . Drug use: No         OPHTHALMIC EXAM:  Base Eye Exam    Visual Acuity (Snellen - Linear)      Right Left   Dist cc 20/25 +1 20/20 -2   Dist ph cc NI    Correction:  Glasses       Tonometry (Tonopen, 8:06 AM)      Right Left   Pressure 14 13       Pupils      Dark Light Shape React APD   Right 4 3 Round Brisk None   Left 4 3 Round Brisk None       Visual Fields (Counting fingers)      Left Right    Full Full       Extraocular Movement      Right Left    Full, Ortho Full, Ortho       Neuro/Psych    Oriented x3: Yes   Mood/Affect: Normal       Dilation    Both eyes: 1.0% Mydriacyl, 2.5% Phenylephrine @ 8:06 AM        Slit Lamp and Fundus Exam    Slit Lamp Exam      Right Left   Lids/Lashes Normal Normal   Conjunctiva/Sclera Mild Melanosis Mild Melanosis   Cornea 3+ Guttata, arcus, trace Punctate epithelial erosions 1+ Punctate epithelial erosions, 3+ Guttata, arcus   Anterior Chamber Deep and quiet Deep and quiet   Iris Round and dilated Round and dilated   Lens 1-2+ Nuclear sclerosis, 2+ Cortical cataract 1-2+ Nuclear sclerosis, 2+ Cortical cataract   Vitreous Vitreous syneresis, vitreous condensations Vitreous syneresis       Fundus Exam      Right Left   Disc Pink and Sharp, Compact Pink and Sharp, Compact   C/D Ratio 0.3 0.4   Macula Flat, Good foveal reflex, ERM greatest superiorly with mild retinal thickening, dot hemes just inside ST arcades - resolved Flat, Good foveal reflex, mild RPE mottling and clumping, No heme or edema   Vessels Vascular attenuation, mild tortuousity Vascular attenuation, mild tortuousity, mild Copper wiring   Periphery Attached, No heme, No RT/RD Attached           Refraction    Wearing Rx      Sphere Cylinder Axis   Right -7.25 +1.25 098   Left -9.00 +2.25 108         IMAGING AND PROCEDURES  Imaging and Procedures for 03/26/2020  OCT, Retina - OU - Both Eyes       Right Eye Quality was good. Central Foveal Thickness: 302. Progression has been stable. Findings include no IRF, no SRF, epiretinal membrane, macular pucker, abnormal foveal contour (ERM superior macula with pucker and  retinal thickening, ?interval increase in retinal thickening, partial PVD).   Left Eye Quality was good. Central Foveal Thickness: 237. Progression has been stable. Findings include normal foveal contour, no IRF, no SRF (Trace ERM; partial PVD).   Notes *Images captured and stored on drive  Diagnosis / Impression:  OD: ERM superior macula with pucker and retinal thickening, ?interval increase in retinal thickening -- stable from  prior OS: NFP, no IRF/SRF  Clinical management:  See below  Abbreviations: NFP - Normal foveal profile. CME - cystoid macular edema. PED - pigment epithelial detachment. IRF - intraretinal fluid. SRF - subretinal fluid. EZ - ellipsoid zone. ERM - epiretinal membrane. ORA - outer retinal atrophy. ORT - outer retinal tubulation. SRHM - subretinal hyper-reflective material. IRHM - intraretinal hyper-reflective material                 ASSESSMENT/PLAN:    ICD-10-CM   1. Epiretinal membrane (ERM) of right eye  H35.371   2. Retinal edema  H35.81 OCT, Retina - OU - Both Eyes  3. Retinal hemorrhage of right eye  H35.61   4. Cortical cataract of both eyes  H26.9   5. 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 Epiretinal membrane, OD - ERM with retina thickening, superior macula, extrafoveal - BCVA 20/25 - asymptomatic, no metamorphopsia - no indication for surgery at this time - monitor for now - f/u 6 mos -- DFE/OCT  3. Retinal hemorrhage OD -- resolved  - punctate dot hemes superior macula within bed of ERM -- stably improved today  - unclear etiology-- no history of HTN or DM; +history of anemia  - no visual significance  - FA 11.2.21 shows focal MA superior macula, no significant leakage  - monitor  4. Cortical Cataract OU - The symptoms of cataract, surgical options, and treatments and risks were discussed with patient. - discussed diagnosis and progression - not yet visually significant - monitor for now  5. Corneal Guttata OU  - no  corneal edema  Ophthalmic Meds Ordered this visit:  No orders of the defined types were placed in this encounter.     Return in about 6 months (around 09/26/2020) for f/u ERM OD, DFE, OCT.  There are no Patient Instructions on file for this visit.   Explained the diagnoses, plan, and follow up with the patient and they expressed understanding.  Patient expressed understanding of the importance of proper follow up care.  This document serves as a record of services personally performed by Karie Chimera, MD, PhD. It was created on their behalf by Annalee Genta, COMT. The creation of this record is the provider's dictation and/or activities during the visit.  Electronically signed by: Annalee Genta, COMT 03/26/20 12:59 PM   This document serves as a record of services personally performed by Karie Chimera, MD, PhD. It was created on their behalf by Glee Arvin. Manson Passey, OA an ophthalmic technician. The creation of this record is the provider's dictation and/or activities during the visit.    Electronically signed by: Glee Arvin. Manson Passey, [place], 03.02.2022 12:59 PM  Karie Chimera, M.D., Ph.D. Diseases & Surgery of the Retina and Vitreous [facility]  I have reviewed the above documentation for accuracy and completeness, and I agree with the above. Karie Chimera, M.D., Ph.D. 03/26/20 12:59 PM   Abbreviations: M myopia (nearsighted); A astigmatism; H hyperopia (farsighted); P presbyopia; Mrx spectacle prescription;  CTL contact lenses; OD right eye; OS left eye; OU both eyes  XT exotropia; ET esotropia; PEK punctate epithelial keratitis; PEE punctate epithelial erosions; DES dry eye syndrome; MGD meibomian gland dysfunction; ATs artificial tears; PFAT's preservative free artificial tears; [place] nuclear sclerotic cataract; PSC posterior subcapsular cataract; ERM epi-retinal membrane; PVD posterior vitreous detachment; RD retinal detachment; DM diabetes mellitus; DR diabetic  retinopathy; NPDR non-proliferative diabetic retinopathy; PDR proliferative diabetic retinopathy;  CSME clinically significant macular edema; DME diabetic macular edema; dbh dot blot hemorrhages; CWS cotton wool spot; POAG primary open angle glaucoma; C/D cup-to-disc ratio; HVF humphrey visual field; GVF goldmann visual field; OCT optical coherence tomography; IOP intraocular pressure; BRVO Branch retinal vein occlusion; CRVO central retinal vein occlusion; CRAO central retinal artery occlusion; BRAO branch retinal artery occlusion; RT retinal tear; SB scleral buckle; PPV pars plana vitrectomy; VH Vitreous hemorrhage; PRP panretinal laser photocoagulation; IVK intravitreal kenalog; VMT vitreomacular traction; MH Macular hole;  NVD neovascularization of the disc; NVE neovascularization elsewhere; AREDS age related eye disease study; ARMD age related macular degeneration; POAG primary open angle glaucoma; EBMD epithelial/anterior basement membrane dystrophy; ACIOL anterior chamber intraocular lens; IOL intraocular lens; PCIOL posterior chamber intraocular lens; Phaco/IOL phacoemulsification with intraocular lens placement; [place] photorefractive keratectomy; LASIK laser assisted in situ keratomileusis; HTN hypertension; DM diabetes mellitus; COPD chronic obstructive pulmonary disease

## 2020-03-26 ENCOUNTER — Encounter (INDEPENDENT_AMBULATORY_CARE_PROVIDER_SITE_OTHER): Payer: Self-pay | Admitting: Ophthalmology

## 2020-03-26 ENCOUNTER — Other Ambulatory Visit: Payer: Self-pay

## 2020-03-26 ENCOUNTER — Ambulatory Visit (INDEPENDENT_AMBULATORY_CARE_PROVIDER_SITE_OTHER): Payer: BC Managed Care – PPO | Admitting: Ophthalmology

## 2020-03-26 DIAGNOSIS — H3561 Retinal hemorrhage, right eye: Secondary | ICD-10-CM

## 2020-03-26 DIAGNOSIS — H3581 Retinal edema: Secondary | ICD-10-CM

## 2020-03-26 DIAGNOSIS — H18513 Endothelial corneal dystrophy, bilateral: Secondary | ICD-10-CM

## 2020-03-26 DIAGNOSIS — H35371 Puckering of macula, right eye: Secondary | ICD-10-CM | POA: Diagnosis not present

## 2020-03-26 DIAGNOSIS — H269 Unspecified cataract: Secondary | ICD-10-CM

## 2020-09-12 ENCOUNTER — Other Ambulatory Visit: Payer: Self-pay | Admitting: Physician Assistant

## 2020-09-12 DIAGNOSIS — Z800 Family history of malignant neoplasm of digestive organs: Secondary | ICD-10-CM

## 2020-09-12 DIAGNOSIS — R109 Unspecified abdominal pain: Secondary | ICD-10-CM

## 2020-09-12 DIAGNOSIS — R634 Abnormal weight loss: Secondary | ICD-10-CM

## 2020-09-26 ENCOUNTER — Encounter (INDEPENDENT_AMBULATORY_CARE_PROVIDER_SITE_OTHER): Payer: BC Managed Care – PPO | Admitting: Ophthalmology

## 2020-10-02 ENCOUNTER — Ambulatory Visit
Admission: RE | Admit: 2020-10-02 | Discharge: 2020-10-02 | Disposition: A | Discharge status: Home / Self Care | Payer: BC Managed Care – PPO | Source: Ambulatory Visit | Attending: Physician Assistant | Admitting: Physician Assistant

## 2020-10-02 ENCOUNTER — Other Ambulatory Visit: Payer: Self-pay

## 2020-10-02 DIAGNOSIS — R109 Unspecified abdominal pain: Secondary | ICD-10-CM

## 2020-10-02 DIAGNOSIS — R634 Abnormal weight loss: Secondary | ICD-10-CM

## 2020-10-02 DIAGNOSIS — Z800 Family history of malignant neoplasm of digestive organs: Secondary | ICD-10-CM

## 2020-10-02 MED ORDER — IOPAMIDOL (ISOVUE-300) INJECTION 61%
100.0000 mL | Freq: Once | INTRAVENOUS | Status: AC | PRN
  Administered 2020-10-02: 100 mL via INTRAVENOUS

## 2020-11-25 NOTE — Progress Notes (Signed)
[facility] Note  12/02/2020     CHIEF COMPLAINT Patient presents for Retina Follow Up   HISTORY OF PRESENT ILLNESS: Valerie Barnes is a 50 y.o. female who presents to the clinic today for:   HPI     Retina Follow Up   Patient presents with  Other.  In right eye.  This started 6 months ago.  I, the attending physician,  performed the HPI with the patient and updated documentation appropriately.        Comments   Patient here for 6 months retina follow up for ERM OD. Patient states vision about the same as always. Eyes are sensitive. Dr Rosana Hoes put one systane for dry eyes.       Last edited by Bernarda Caffey, MD on 12/04/2020  2:13 PM.    Pt states she can't tell any difference in her vision   Referring physician: Leticia Clas, [address] [place] [address] [place],  [place] [address]  HISTORICAL INFORMATION:   Selected notes from the MEDICAL RECORD NUMBER Referred by Dr. Rosana Hoes for ret eval OU   CURRENT MEDICATIONS: No current outpatient medications on file. (Ophthalmic Drugs)   No current facility-administered medications for this visit. (Ophthalmic Drugs)   Current Outpatient Medications (Other)  Medication Sig   buPROPion (WELLBUTRIN SR) 150 MG 12 hr tablet Take 150 mg by mouth daily.   cetirizine (ZYRTEC) 10 MG chewable tablet Chew 10 mg by mouth daily.   doxycycline (ADOXA) 100 MG tablet Take 100 mg by mouth 2 (two) times daily.   fexofenadine (ALLEGRA) 180 MG tablet Take 180 mg by mouth daily.   ibuprofen (ADVIL,MOTRIN) 800 MG tablet Take 1 tablet (800 mg total) by mouth every 8 (eight) hours as needed for moderate pain (mild pain).   oxyCODONE-acetaminophen (PERCOCET/ROXICET) 5-325 MG per tablet Take 1-2 tablets by mouth every 4 (four) hours as needed for severe pain (moderate to severe pain (when tolerating fluids)).   No current facility-administered medications for this visit. (Other)   Facility-Administered Medications Ordered in  Other Visits (Other)  Medication Route   dexamethasone (DECADRON) injection    fentaNYL (SUBLIMAZE) injection    lidocaine (cardiac) 100 mg/56m (XYLOCAINE) 20 MG/ML injection 2%    midazolam (VERSED) injection    ondansetron (ZOFRAN) injection    REVIEW OF SYSTEMS: ROS   Positive for: Eyes, Heme/Lymph Negative for: Constitutional, Gastrointestinal, Neurological, Skin, Genitourinary, Musculoskeletal, HENT, Endocrine, Cardiovascular, Respiratory, Psychiatric, Allergic/Imm Last edited by CTheodore Demark COA on 12/02/2020  9:25 AM.      ALLERGIES Allergies  Allergen Reactions   Codeine Hives   Morphine And Related Hives    Has tolerated hydromorphone in the past   PAST MEDICAL HISTORY Past Medical History:  Diagnosis Date   Anemia    DUB   Depression    GERD (gastroesophageal reflux disease)    Past Surgical History:  Procedure Laterality Date   ABDOMINAL HYSTERECTOMY N/A 07/18/2014   Procedure: HYSTERECTOMY ABDOMINAL;  Surgeon: RMolli Posey MD;  [facility];  Service: Gynecology;  Laterality: N/A;   BILATERAL SALPINGECTOMY Bilateral 07/18/2014   Procedure: BILATERAL SALPINGECTOMY;  Surgeon: RMolli Posey MD;  [facility];  Service: Gynecology;  Laterality: Bilateral;   fibrois embolisation     LAPAROTOMY  2002   endometrial mass removed   UTERINE FIBROID EMBOLIZATION     FAMILY HISTORY History reviewed. No pertinent family history.  SOCIAL HISTORY Social History   Tobacco Use  Smoking status: Never   Smokeless tobacco: Never  Substance Use Topics   Alcohol use: Yes    Comment: occassional    Drug use: No       OPHTHALMIC EXAM: Base Eye Exam     Visual Acuity (Snellen - Linear)       Right Left   Dist cc 20/25 -2 20/25 -2   Dist ph cc NI NI    Correction: Glasses         Tonometry (Tonopen, 9:22 AM)       Right Left   Pressure 13 13         Pupils       Dark Light Shape React APD   Right 4 3 Round Brisk None   Left 4 3  Round Brisk None         Visual Fields (Counting fingers)       Left Right    Full Full         Extraocular Movement       Right Left    Full, Ortho Full, Ortho         Neuro/Psych     Oriented x3: Yes   Mood/Affect: Normal         Dilation     Both eyes: 1.0% Mydriacyl, 2.5% Phenylephrine @ 9:22 AM           Slit Lamp and Fundus Exam     Slit Lamp Exam       Right Left   Lids/Lashes Normal Normal   Conjunctiva/Sclera Mild Melanosis Mild Melanosis   Cornea 3+ Guttata, arcus, trace Punctate epithelial erosions 1+ Punctate epithelial erosions, 3+ Guttata, arcus, trace tear film debris   Anterior Chamber Deep and quiet Deep and quiet   Iris Round and dilated Round and dilated   Lens 2+ Nuclear sclerosis, 2+ Cortical cataract 1-2+ Nuclear sclerosis, 2+ Cortical cataract   Vitreous Vitreous syneresis, vitreous condensations, Posterior vitreous detachment Vitreous syneresis         Fundus Exam       Right Left   Disc Pink and Sharp, Compact Pink and Sharp, Compact   C/D Ratio 0.3 0.4   Macula Flat, Good foveal reflex, ERM greatest superiorly with mild retinal thickening and striae and early fibrosis, dot hemes just inside ST arcades  Flat, Good foveal reflex, mild RPE mottling and clumping, No heme or edema   Vessels Vascular attenuation, mild tortuousity, mild Copper wiring Vascular attenuation, mild tortuousity, mild Copper wiring   Periphery Attached, No heme, No RT/RD Attached, No heme            Refraction     Wearing Rx       Sphere Cylinder Axis   Right -7.25 +1.25 098   Left -9.00 +2.25 108           IMAGING AND PROCEDURES  Imaging and Procedures for 12/02/2020  OCT, Retina - OU - Both Eyes       Right Eye Quality was good. Central Foveal Thickness: 302. Progression has worsened. Findings include no IRF, no SRF, epiretinal membrane, macular pucker, abnormal foveal contour (Interval progression of ERM, pucker and PRF SN macula).    Left Eye Quality was good. Central Foveal Thickness: 238. Progression has been stable. Findings include normal foveal contour, no IRF, no SRF (Trace ERM; partial PVD).   Notes *Images captured and stored on drive  Diagnosis / Impression:  OD: Interval progression of ERM, pucker and PRF SN macula  OS: NFP, no IRF/SRF  Clinical management:  See below  Abbreviations: NFP - Normal foveal profile. CME - cystoid macular edema. PED - pigment epithelial detachment. IRF - intraretinal fluid. SRF - subretinal fluid. EZ - ellipsoid zone. ERM - epiretinal membrane. ORA - outer retinal atrophy. ORT - outer retinal tubulation. SRHM - subretinal hyper-reflective material. IRHM - intraretinal hyper-reflective material            ASSESSMENT/PLAN:    ICD-10-CM   1. Epiretinal membrane (ERM) of right eye  H35.371     2. Retinal edema  H35.81 OCT, Retina - OU - Both Eyes    3. Retinal hemorrhage of right eye  H35.61     4. Cortical cataract of both eyes  H26.9     5. 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 Epiretinal membrane, OD - ERM with retinal thickening, superior macula, extrafoveal - BCVA 20/25 -- stable - OCT shows mild interval progression of ERM, pucker and PRF SN macula - asymptomatic, no metamorphopsia - no indication for surgery at this time - monitor for now - f/u 8 mos -- DFE/OCT  3. Retinal hemorrhage OD -- resolved  - punctate dot hemes superior macula within bed of ERM -- stably improved today  - unclear etiology-- no history of HTN or DM; +history of anemia  - no visual significance  - FA 11.2.21 shows focal MA superior macula, no significant leakage  - monitor  4. Cortical Cataract OU - The symptoms of cataract, surgical options, and treatments and risks were discussed with patient. - discussed diagnosis and progression - not yet visually significant - monitor for now  5. Corneal Guttata OU  - no corneal edema  Ophthalmic Meds Ordered this visit:   No orders of the defined types were placed in this encounter.     Return in about 8 months (around 08/01/2021) for f/u ERM OD, DFE, OCT.  There are no Patient Instructions on file for this visit.   Explained the diagnoses, plan, and follow up with the patient and they expressed understanding.  Patient expressed understanding of the importance of proper follow up care.  This document serves as a record of services personally performed by Gardiner Sleeper, MD, PhD. It was created on their behalf by Estill Bakes, COT an ophthalmic technician. The creation of this record is the provider's dictation and/or activities during the visit.    Electronically signed by: Estill Bakes, COT 11.1.22 @ 2:27 PM   This document serves as a record of services personally performed by Gardiner Sleeper, MD, PhD. It was created on their behalf by San Jetty. Owens Shark, OA an ophthalmic technician. The creation of this record is the provider's dictation and/or activities during the visit.    Electronically signed by: San Jetty. Owens Shark, [place] 11.08.2022 2:27 PM   Gardiner Sleeper, M.D., Ph.D. Diseases & Surgery of the Retina and Vitreous [facility]  I have reviewed the above documentation for accuracy and completeness, and I agree with the above. Gardiner Sleeper, M.D., Ph.D. 12/04/20 2:27 PM   Abbreviations: M myopia (nearsighted); A astigmatism; H hyperopia (farsighted); P presbyopia; Mrx spectacle prescription;  CTL contact lenses; OD right eye; OS left eye; OU both eyes  XT exotropia; ET esotropia; PEK punctate epithelial keratitis; PEE punctate epithelial erosions; DES dry eye syndrome; MGD meibomian gland dysfunction; ATs artificial tears; PFAT's preservative free artificial tears; [place] nuclear sclerotic cataract; PSC posterior subcapsular cataract;  ERM epi-retinal membrane; PVD posterior vitreous detachment; RD retinal detachment; DM diabetes mellitus; DR diabetic retinopathy; NPDR  non-proliferative diabetic retinopathy; PDR proliferative diabetic retinopathy; CSME clinically significant macular edema; DME diabetic macular edema; dbh dot blot hemorrhages; CWS cotton wool spot; POAG primary open angle glaucoma; C/D cup-to-disc ratio; HVF humphrey visual field; GVF goldmann visual field; OCT optical coherence tomography; IOP intraocular pressure; BRVO Branch retinal vein occlusion; CRVO central retinal vein occlusion; CRAO central retinal artery occlusion; BRAO branch retinal artery occlusion; RT retinal tear; SB scleral buckle; PPV pars plana vitrectomy; VH Vitreous hemorrhage; PRP panretinal laser photocoagulation; IVK intravitreal kenalog; VMT vitreomacular traction; MH Macular hole;  NVD neovascularization of the disc; NVE neovascularization elsewhere; AREDS age related eye disease study; ARMD age related macular degeneration; POAG primary open angle glaucoma; EBMD epithelial/anterior basement membrane dystrophy; ACIOL anterior chamber intraocular lens; IOL intraocular lens; PCIOL posterior chamber intraocular lens; Phaco/IOL phacoemulsification with intraocular lens placement; [place] photorefractive keratectomy; LASIK laser assisted in situ keratomileusis; HTN hypertension; DM diabetes mellitus; COPD chronic obstructive pulmonary disease

## 2020-12-02 ENCOUNTER — Encounter (INDEPENDENT_AMBULATORY_CARE_PROVIDER_SITE_OTHER): Payer: Self-pay | Admitting: Ophthalmology

## 2020-12-02 ENCOUNTER — Other Ambulatory Visit: Payer: Self-pay

## 2020-12-02 ENCOUNTER — Ambulatory Visit (INDEPENDENT_AMBULATORY_CARE_PROVIDER_SITE_OTHER): Payer: BC Managed Care – PPO | Admitting: Ophthalmology

## 2020-12-02 DIAGNOSIS — H35371 Puckering of macula, right eye: Secondary | ICD-10-CM | POA: Diagnosis not present

## 2020-12-02 DIAGNOSIS — H3581 Retinal edema: Secondary | ICD-10-CM

## 2020-12-02 DIAGNOSIS — H269 Unspecified cataract: Secondary | ICD-10-CM | POA: Diagnosis not present

## 2020-12-02 DIAGNOSIS — H18513 Endothelial corneal dystrophy, bilateral: Secondary | ICD-10-CM | POA: Diagnosis not present

## 2020-12-02 DIAGNOSIS — H3561 Retinal hemorrhage, right eye: Secondary | ICD-10-CM

## 2020-12-04 ENCOUNTER — Encounter (INDEPENDENT_AMBULATORY_CARE_PROVIDER_SITE_OTHER): Payer: Self-pay | Admitting: Ophthalmology

## 2021-07-30 NOTE — Progress Notes (Signed)
[facility] Note  08/03/2021     CHIEF COMPLAINT Patient presents for Retina Follow Up    HISTORY OF PRESENT ILLNESS: Valerie Barnes is a 51 y.o. female who presents to the clinic today for:   HPI     Retina Follow Up   Patient presents with  Other.  In right eye.  This started months ago.  Duration of 8 months.  I, the attending physician,  performed the HPI with the patient and updated documentation appropriately.        Comments   Patient was told that she has chronic dry eyes. She feels that the vision has gotten blurrier over the past 8 months. She did get new glasses that she is waiting for them to come in. She is using Systane OU BID.       Last edited by Bernarda Caffey, MD on 08/03/2021  1:53 PM.    Pt states that the vision is the same. She ordered new glasses. She complains of dry eyes.    Referring physician: Leticia Clas, [address] [place],  [place] [address]  HISTORICAL INFORMATION:   Selected notes from the MEDICAL RECORD NUMBER Referred by Dr. Rosana Hoes for ret eval OU   CURRENT MEDICATIONS: No current outpatient medications on file. (Ophthalmic Drugs)   No current facility-administered medications for this visit. (Ophthalmic Drugs)   Current Outpatient Medications (Other)  Medication Sig   buPROPion (WELLBUTRIN SR) 150 MG 12 hr tablet Take 150 mg by mouth daily.   doxycycline (ADOXA) 100 MG tablet Take 100 mg by mouth 2 (two) times daily.   ibuprofen (ADVIL,MOTRIN) 800 MG tablet Take 1 tablet (800 mg total) by mouth every 8 (eight) hours as needed for moderate pain (mild pain).   cetirizine (ZYRTEC) 10 MG chewable tablet Chew 10 mg by mouth daily.   fexofenadine (ALLEGRA) 180 MG tablet Take 180 mg by mouth daily.   oxyCODONE-acetaminophen (PERCOCET/ROXICET) 5-325 MG per tablet Take 1-2 tablets by mouth every 4 (four) hours as needed for severe pain (moderate to severe pain (when tolerating fluids)).   No  current facility-administered medications for this visit. (Other)   Facility-Administered Medications Ordered in Other Visits (Other)  Medication Route   dexamethasone (DECADRON) injection    fentaNYL (SUBLIMAZE) injection    lidocaine (cardiac) 100 mg/58m (XYLOCAINE) 20 MG/ML injection 2%    midazolam (VERSED) injection    ondansetron (ZOFRAN) injection    REVIEW OF SYSTEMS:    ALLERGIES Allergies  Allergen Reactions   Codeine Hives   Morphine And Related Hives    Has tolerated hydromorphone in the past   PAST MEDICAL HISTORY Past Medical History:  Diagnosis Date   Anemia    DUB   Depression    GERD (gastroesophageal reflux disease)    Past Surgical History:  Procedure Laterality Date   ABDOMINAL HYSTERECTOMY N/A 07/18/2014   Procedure: HYSTERECTOMY ABDOMINAL;  Surgeon: RMolli Posey MD;  [facility];  Service: Gynecology;  Laterality: N/A;   BILATERAL SALPINGECTOMY Bilateral 07/18/2014   Procedure: BILATERAL SALPINGECTOMY;  Surgeon: RMolli Posey MD;  [facility];  Service: Gynecology;  Laterality: Bilateral;   fibrois embolisation     LAPAROTOMY  2002   endometrial mass removed   UTERINE FIBROID EMBOLIZATION     FAMILY HISTORY History reviewed. No pertinent family history.  SOCIAL HISTORY Social History   Tobacco Use   Smoking status: Never   Smokeless tobacco: Never  Substance Use Topics   Alcohol use: Yes    Comment: occassional    Drug use: No       OPHTHALMIC EXAM: Base Eye Exam     Visual Acuity (Snellen - Linear)       Right Left   Dist cc 20/30 20/25   Dist ph cc NI NI    Correction: Glasses         Tonometry (Tonopen, 9:35 AM)       Right Left   Pressure 14 15         Pupils       Pupils Dark Light Shape React APD   Right PERRL 4 3 Round Brisk None   Left PERRL 4 3 Round Brisk None         Visual Fields       Left Right    Full Full         Extraocular Movement       Right Left    Full, Ortho  Full, Ortho         Neuro/Psych     Oriented x3: Yes   Mood/Affect: Normal         Dilation     Both eyes: 1.0% Mydriacyl, 2.5% Phenylephrine @ 9:34 AM           Slit Lamp and Fundus Exam     Slit Lamp Exam       Right Left   Lids/Lashes Normal Normal   Conjunctiva/Sclera Mild Melanosis Mild Melanosis   Cornea 3+ Guttata, arcus, trace Punctate epithelial erosions Trace Punctate epithelial erosions, 3+ Guttata, arcus, trace tear film debris   Anterior Chamber Deep and quiet Deep and quiet   Iris Round and dilated Round and dilated   Lens 2+ Nuclear sclerosis, 2+ Cortical cataract 1-2+ Nuclear sclerosis, 2+ Cortical cataract   Anterior Vitreous Vitreous syneresis, vitreous condensations, Posterior vitreous detachment Vitreous syneresis         Fundus Exam       Right Left   Disc Pink and Sharp, Compact Pink and Sharp, Compact   C/D Ratio 0.3 0.4   Macula Flat, Good foveal reflex, ERM greatest superiorly with mild retinal thickening and striae and early fibrosis, dot hemes just inside ST arcades -- improved Flat, Good foveal reflex, mild RPE mottling and clumping, No heme or edema   Vessels Vascular attenuation, mild tortuousity, mild Copper wiring Vascular attenuation, mild tortuousity, mild Copper wiring   Periphery Attached, No heme, No RT/RD Attached, No heme           Refraction     Wearing Rx       Sphere Cylinder Axis   Right -7.25 +1.25 098   Left -9.00 +2.25 108           IMAGING AND PROCEDURES  Imaging and Procedures for 08/03/2021  OCT, Retina - OU - Both Eyes       Right Eye Quality was good. Central Foveal Thickness: 363. Progression has worsened. Findings include no IRF, no SRF, abnormal foveal contour, epiretinal membrane, macular pucker (persistent ERM with pucker and PRF SN macula; mild interval blunting of foveal contour/central thickening).   Left Eye Quality was good. Central Foveal Thickness: 242. Progression has been stable.  Findings include normal foveal contour, no IRF, no SRF (Trace ERM; partial PVD).   Notes *Images captured and stored on drive  Diagnosis / Impression:  OD: persistent ERM with pucker and PRF SN macula; mild interval blunting of  foveal contour/central thickening OS: NFP, no IRF/SRF  Clinical management:  See below  Abbreviations: NFP - Normal foveal profile. CME - cystoid macular edema. PED - pigment epithelial detachment. IRF - intraretinal fluid. SRF - subretinal fluid. EZ - ellipsoid zone. ERM - epiretinal membrane. ORA - outer retinal atrophy. ORT - outer retinal tubulation. SRHM - subretinal hyper-reflective material. IRHM - intraretinal hyper-reflective material             ASSESSMENT/PLAN:    ICD-10-CM   1. Epiretinal membrane (ERM) of right eye  H35.371 OCT, Retina - OU - Both Eyes    2. Retinal hemorrhage of right eye  H35.61     3. Cortical cataract of both eyes  H26.9     4. 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 Epiretinal membrane, OD - ERM with retinal thickening, superior macula, extrafoveal - BCVA 20/30, 20/25 -- stable - OCT shows stable ERM w/ pucker and PRF SN macula; mild interval blunting of foveal contour/central thickening - asymptomatic, no metamorphopsia - no indication for surgery at this time - monitor for now - f/u 8 mos -- DFE/OCT  2. Retinal hemorrhage OD -- resolved  - punctate dot hemes superior macula within bed of ERM -- stably improved today  - unclear etiology-- no history of HTN or DM; +history of anemia  - no visual significance  - FA 11.2.21 shows focal MA superior macula, no significant leakage  - monitor  3. Cortical Cataract OU - The symptoms of cataract, surgical options, and treatments and risks were discussed with patient. - discussed diagnosis and progression - not yet visually significant - monitor for now  4. Corneal Guttata OU  - no corneal edema  Ophthalmic Meds Ordered this visit:  No orders of the  defined types were placed in this encounter.      Return in about 8 months (around 04/04/2022) for DFE, OCT.  There are no Patient Instructions on file for this visit.   Explained the diagnoses, plan, and follow up with the patient and they expressed understanding.  Patient expressed understanding of the importance of proper follow up care.  This document serves as a record of services personally performed by Gardiner Sleeper, MD, PhD. It was created on their behalf by Roselee Nova, COMT. The creation of this record is the provider's dictation and/or activities during the visit.  Electronically signed by: Roselee Nova, COMT 08/03/21 2:00 PM  This document serves as a record of services personally performed by Gardiner Sleeper, MD, PhD. It was created on their behalf by Renaldo Reel, [place] an ophthalmic technician. The creation of this record is the provider's dictation and/or activities during the visit.    Electronically signed by:  Renaldo Reel, COT  08/03/21 2:00 PM  Gardiner Sleeper, M.D., Ph.D. Diseases & Surgery of the Retina and Vitreous [facility]  I have reviewed the above documentation for accuracy and completeness, and I agree with the above. Gardiner Sleeper, M.D., Ph.D. 08/03/21 2:01 PM  Abbreviations: M myopia (nearsighted); A astigmatism; H hyperopia (farsighted); P presbyopia; Mrx spectacle prescription;  CTL contact lenses; OD right eye; OS left eye; OU both eyes  XT exotropia; ET esotropia; PEK punctate epithelial keratitis; PEE punctate epithelial erosions; DES dry eye syndrome; MGD meibomian gland dysfunction; ATs artificial tears; PFAT's preservative free artificial tears; [place] nuclear sclerotic cataract; PSC posterior subcapsular cataract; ERM epi-retinal membrane; PVD posterior vitreous detachment; RD retinal  detachment; DM diabetes mellitus; DR diabetic retinopathy; NPDR non-proliferative diabetic retinopathy; PDR proliferative diabetic  retinopathy; CSME clinically significant macular edema; DME diabetic macular edema; dbh dot blot hemorrhages; CWS cotton wool spot; POAG primary open angle glaucoma; C/D cup-to-disc ratio; HVF humphrey visual field; GVF goldmann visual field; OCT optical coherence tomography; IOP intraocular pressure; BRVO Branch retinal vein occlusion; CRVO central retinal vein occlusion; CRAO central retinal artery occlusion; BRAO branch retinal artery occlusion; RT retinal tear; SB scleral buckle; PPV pars plana vitrectomy; VH Vitreous hemorrhage; PRP panretinal laser photocoagulation; IVK intravitreal kenalog; VMT vitreomacular traction; MH Macular hole;  NVD neovascularization of the disc; NVE neovascularization elsewhere; AREDS age related eye disease study; ARMD age related macular degeneration; POAG primary open angle glaucoma; EBMD epithelial/anterior basement membrane dystrophy; ACIOL anterior chamber intraocular lens; IOL intraocular lens; PCIOL posterior chamber intraocular lens; Phaco/IOL phacoemulsification with intraocular lens placement; [place] photorefractive keratectomy; LASIK laser assisted in situ keratomileusis; HTN hypertension; DM diabetes mellitus; COPD chronic obstructive pulmonary disease

## 2021-08-03 ENCOUNTER — Ambulatory Visit (INDEPENDENT_AMBULATORY_CARE_PROVIDER_SITE_OTHER): Payer: BC Managed Care – PPO | Admitting: Ophthalmology

## 2021-08-03 ENCOUNTER — Encounter (INDEPENDENT_AMBULATORY_CARE_PROVIDER_SITE_OTHER): Payer: Self-pay | Admitting: Ophthalmology

## 2021-08-03 DIAGNOSIS — H18513 Endothelial corneal dystrophy, bilateral: Secondary | ICD-10-CM

## 2021-08-03 DIAGNOSIS — H3561 Retinal hemorrhage, right eye: Secondary | ICD-10-CM | POA: Diagnosis not present

## 2021-08-03 DIAGNOSIS — H35371 Puckering of macula, right eye: Secondary | ICD-10-CM | POA: Diagnosis not present

## 2021-08-03 DIAGNOSIS — H269 Unspecified cataract: Secondary | ICD-10-CM | POA: Diagnosis not present

## 2022-03-30 NOTE — Progress Notes (Signed)
[facility] Note  04/05/2022     CHIEF COMPLAINT Patient presents for Retina Follow Up    HISTORY OF PRESENT ILLNESS: Valerie Barnes is a 52 y.o. female who presents to the clinic today for:   HPI     Retina Follow Up   Patient presents with  Other.  In right eye.  Severity is moderate.  Duration of 8 months.  Since onset it is stable.  I, the attending physician,  performed the HPI with the patient and updated documentation appropriately.        Comments   Pt here for 8 mo ret f/u ERM OD. Pt states VA is about the same but seems to be having more difficulty seeing on the computer or reading road signs at the end of the day. Unsure if its a VA change or fatigue. Pt did sprain her ankle badly last week, in a boot, left foot.       Last edited by Bernarda Caffey, MD on 04/05/2022  5:22 PM.    Pt states she is having more difficulty seeing her computer and at the end of the day she has a hard time reading street signs, she denies distortion, she uses [employer] [facility]   Referring physician: Leticia Clas, [place] [place],  [place] [address]  HISTORICAL INFORMATION:   Selected notes from the MEDICAL RECORD NUMBER Referred by Dr. Rosana Hoes for ret eval OU   CURRENT MEDICATIONS: No current outpatient medications on file. (Ophthalmic Drugs)   No current facility-administered medications for this visit. (Ophthalmic Drugs)   Current Outpatient Medications (Other)  Medication Sig   buPROPion (WELLBUTRIN SR) 150 MG 12 hr tablet Take 150 mg by mouth daily.   ibuprofen (ADVIL,MOTRIN) 800 MG tablet Take 1 tablet (800 mg total) by mouth every 8 (eight) hours as needed for moderate pain (mild pain).   cetirizine (ZYRTEC) 10 MG chewable tablet Chew 10 mg by mouth daily.   doxycycline (ADOXA) 100 MG tablet Take 100 mg by mouth 2 (two) times daily. (Patient not taking: Reported on 04/05/2022)   fexofenadine (ALLEGRA) 180 MG tablet Take 180 mg by mouth  daily.   oxyCODONE-acetaminophen (PERCOCET/ROXICET) 5-325 MG per tablet Take 1-2 tablets by mouth every 4 (four) hours as needed for severe pain (moderate to severe pain (when tolerating fluids)).   No current facility-administered medications for this visit. (Other)   Facility-Administered Medications Ordered in Other Visits (Other)  Medication Route   dexamethasone (DECADRON) injection    fentaNYL (SUBLIMAZE) injection    lidocaine (cardiac) 100 mg/57m (XYLOCAINE) 20 MG/ML injection 2%    midazolam (VERSED) injection    ondansetron (ZOFRAN) injection    REVIEW OF SYSTEMS: ROS   Positive for: Eyes, Heme/Lymph Negative for: Constitutional, Gastrointestinal, Neurological, Skin, Genitourinary, Musculoskeletal, HENT, Endocrine, Cardiovascular, Respiratory, Psychiatric, Allergic/Imm Last edited by SKingsley Spittle COT on 04/05/2022  8:09 AM.     ALLERGIES Allergies  Allergen Reactions   Codeine Hives   Morphine And Related Hives    Has tolerated hydromorphone in the past   PAST MEDICAL HISTORY Past Medical History:  Diagnosis Date   Anemia    DUB   Depression    GERD (gastroesophageal reflux disease)    Past Surgical History:  Procedure Laterality Date   ABDOMINAL HYSTERECTOMY N/A 07/18/2014   Procedure: HYSTERECTOMY ABDOMINAL;  Surgeon: RMolli Posey MD;  [facility];  Service: Gynecology;  Laterality: N/A;   BILATERAL SALPINGECTOMY  Bilateral 07/18/2014   Procedure: BILATERAL SALPINGECTOMY;  Surgeon: Molli Posey, MD;  Location: [facility];  Service: Gynecology;  Laterality: Bilateral;   fibrois embolisation     LAPAROTOMY  2002   endometrial mass removed   UTERINE FIBROID EMBOLIZATION     FAMILY HISTORY History reviewed. No pertinent family history.  SOCIAL HISTORY Social History   Tobacco Use   Smoking status: Never   Smokeless tobacco: Never  Substance Use Topics   Alcohol use: Yes    Comment: occassional    Drug use: No       OPHTHALMIC  EXAM: Base Eye Exam     Visual Acuity (Snellen - Linear)       Right Left   Dist cc 20/25 -2 20/25 -2   Dist ph cc NI NI    Correction: Glasses         Tonometry (Tonopen, 8:13 AM)       Right Left   Pressure 13 14         Pupils       Pupils Dark Light Shape React APD   Right PERRL 4 3 Round Brisk None   Left PERRL 4 3 Round Brisk None         Visual Fields (Counting fingers)       Left Right    Full Full         Extraocular Movement       Right Left    Full, Ortho Full, Ortho         Neuro/Psych     Oriented x3: Yes   Mood/Affect: Normal         Dilation     Both eyes: 1.0% Mydriacyl, 2.5% Phenylephrine @ 8:14 AM           Slit Lamp and Fundus Exam     Slit Lamp Exam       Right Left   Lids/Lashes Normal Normal   Conjunctiva/Sclera White and quiet White and quiet   Cornea 3+ Guttata, arcus, trace Punctate epithelial erosions, mild tear film debris Trace Punctate epithelial erosions, 3+ Guttata, arcus, trace tear film debris   Anterior Chamber deep and clear deep and clear   Iris Round and dilated Round and dilated   Lens 2+ Nuclear sclerosis, 2-3+ Cortical cataract 2+ Nuclear sclerosis, 2-3+ Cortical cataract   Anterior Vitreous Vitreous syneresis, vitreous condensations, Posterior vitreous detachment mild syneresis         Fundus Exam       Right Left   Disc Pink and Sharp, Compact Pink and Sharp, Compact   C/D Ratio 0.3 0.4   Macula Flat, Good foveal reflex, ERM greatest superiorly with mild retinal thickening, striae and early fibrosis, dot hemes just inside ST arcades -- stably improved -- no heme Flat, Good foveal reflex, RPE mottling, No heme or edema   Vessels attenuated, mild tortuosity attenuated, mild tortuosity   Periphery Attached, No heme, No RT/RD Attached, No heme           Refraction     Wearing Rx       Sphere Cylinder Axis   Right -7.25 +1.25 098   Left -9.00 +2.25 108           IMAGING AND  PROCEDURES  Imaging and Procedures for 04/05/2022  OCT, Retina - OU - Both Eyes       Right Eye Quality was good. Central Foveal Thickness: 311. Progression has improved. Findings include no IRF, no SRF,  abnormal foveal contour, epiretinal membrane, macular pucker, preretinal fibrosis (Stable ERM with pucker and mild interval improvement in PRF and foveal contour/central thickening).   Left Eye Quality was good. Central Foveal Thickness: 239. Progression has been stable. Findings include normal foveal contour, no IRF, no SRF (Trace ERM; partial PVD).   Notes *Images captured and stored on drive  Diagnosis / Impression:  OD: Stable ERM with pucker and mild interval improvement in PRF and foveal contour/central thickening OS: NFP, no IRF/SRF  Clinical management:  See below  Abbreviations: NFP - Normal foveal profile. CME - cystoid macular edema. PED - pigment epithelial detachment. IRF - intraretinal fluid. SRF - subretinal fluid. EZ - ellipsoid zone. ERM - epiretinal membrane. ORA - outer retinal atrophy. ORT - outer retinal tubulation. SRHM - subretinal hyper-reflective material. IRHM - intraretinal hyper-reflective material            ASSESSMENT/PLAN:    ICD-10-CM   1. Epiretinal membrane (ERM) of right eye  H35.371 OCT, Retina - OU - Both Eyes    2. Cortical cataract of both eyes  H26.9     3. 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 Epiretinal membrane, OD - ERM with retinal thickening, superior macula, extrafoveal - BCVA 20/25 -- improved from 20/30 - OCT shows Stable ERM with pucker and mild interval improvement in PRF and foveal contour/central thickening  - asymptomatic, no metamorphopsia - no indication for surgery at this time - monitor for now - f/u 9 mos -- DFE/OCT  2. Cortical Cataract OU - The symptoms of cataract, surgical options, and treatments and risks were discussed with patient. - discussed diagnosis and progression - monitor  3. Corneal  Guttata OU  - no corneal edema, but may be contributing to symptoms of decreased vision later in the day and evening  Ophthalmic Meds Ordered this visit:  No orders of the defined types were placed in this encounter.    Return in about 9 months (around 01/05/2023) for f/u ERM OD, DFE, OCT.  There are no Patient Instructions on file for this visit.   Explained the diagnoses, plan, and follow up with the patient and they expressed understanding.  Patient expressed understanding of the importance of proper follow up care.  This document serves as a record of services personally performed by Gardiner Sleeper, MD, PhD. It was created on their behalf by Roselee Nova, COMT. The creation of this record is the provider's dictation and/or activities during the visit.  Electronically signed by: Roselee Nova, COMT 04/05/22 5:23 PM  This document serves as a record of services personally performed by Gardiner Sleeper, MD, PhD. It was created on their behalf by San Jetty. Owens Shark, OA an ophthalmic technician. The creation of this record is the provider's dictation and/or activities during the visit.    Electronically signed by: San Jetty. Owens Shark, [place] 03.11.2024  5:23 PM  Gardiner Sleeper, M.D., Ph.D. Diseases & Surgery of the Retina and Vitreous [facility]  I have reviewed the above documentation for accuracy and completeness, and I agree with the above. Gardiner Sleeper, M.D., Ph.D. 04/05/22 5:25 PM   Abbreviations: M myopia (nearsighted); A astigmatism; H hyperopia (farsighted); P presbyopia; Mrx spectacle prescription;  CTL contact lenses; OD right eye; OS left eye; OU both eyes  XT exotropia; ET esotropia; PEK punctate epithelial keratitis; PEE punctate epithelial erosions; DES dry eye syndrome; MGD meibomian gland dysfunction; ATs artificial tears; PFAT's preservative  free artificial tears; [place] nuclear sclerotic cataract; PSC posterior subcapsular cataract; ERM epi-retinal  membrane; PVD posterior vitreous detachment; RD retinal detachment; DM diabetes mellitus; DR diabetic retinopathy; NPDR non-proliferative diabetic retinopathy; PDR proliferative diabetic retinopathy; CSME clinically significant macular edema; DME diabetic macular edema; dbh dot blot hemorrhages; CWS cotton wool spot; POAG primary open angle glaucoma; C/D cup-to-disc ratio; HVF humphrey visual field; GVF goldmann visual field; OCT optical coherence tomography; IOP intraocular pressure; BRVO Branch retinal vein occlusion; CRVO central retinal vein occlusion; CRAO central retinal artery occlusion; BRAO branch retinal artery occlusion; RT retinal tear; SB scleral buckle; PPV pars plana vitrectomy; VH Vitreous hemorrhage; PRP panretinal laser photocoagulation; IVK intravitreal kenalog; VMT vitreomacular traction; MH Macular hole;  NVD neovascularization of the disc; NVE neovascularization elsewhere; AREDS age related eye disease study; ARMD age related macular degeneration; POAG primary open angle glaucoma; EBMD epithelial/anterior basement membrane dystrophy; ACIOL anterior chamber intraocular lens; IOL intraocular lens; PCIOL posterior chamber intraocular lens; Phaco/IOL phacoemulsification with intraocular lens placement; [place] photorefractive keratectomy; LASIK laser assisted in situ keratomileusis; HTN hypertension; DM diabetes mellitus; COPD chronic obstructive pulmonary disease

## 2022-04-05 ENCOUNTER — Encounter (INDEPENDENT_AMBULATORY_CARE_PROVIDER_SITE_OTHER): Payer: Self-pay | Admitting: Ophthalmology

## 2022-04-05 ENCOUNTER — Ambulatory Visit (INDEPENDENT_AMBULATORY_CARE_PROVIDER_SITE_OTHER): Payer: BC Managed Care – PPO | Admitting: Ophthalmology

## 2022-04-05 DIAGNOSIS — H3561 Retinal hemorrhage, right eye: Secondary | ICD-10-CM

## 2022-04-05 DIAGNOSIS — H269 Unspecified cataract: Secondary | ICD-10-CM | POA: Diagnosis not present

## 2022-04-05 DIAGNOSIS — H18513 Endothelial corneal dystrophy, bilateral: Secondary | ICD-10-CM | POA: Diagnosis not present

## 2022-04-05 DIAGNOSIS — H35371 Puckering of macula, right eye: Secondary | ICD-10-CM

## 2022-06-28 ENCOUNTER — Other Ambulatory Visit: Payer: Self-pay | Admitting: Obstetrics and Gynecology

## 2022-06-28 DIAGNOSIS — R928 Other abnormal and inconclusive findings on diagnostic imaging of breast: Secondary | ICD-10-CM

## 2022-07-01 ENCOUNTER — Ambulatory Visit
Admission: RE | Admit: 2022-07-01 | Discharge: 2022-07-01 | Disposition: A | Discharge status: Home / Self Care | Payer: BC Managed Care – PPO | Source: Ambulatory Visit | Attending: Obstetrics and Gynecology | Admitting: Obstetrics and Gynecology

## 2022-07-01 DIAGNOSIS — R928 Other abnormal and inconclusive findings on diagnostic imaging of breast: Secondary | ICD-10-CM

## 2022-12-29 NOTE — Progress Notes (Signed)
[facility] Clinic Note  01/05/2023     CHIEF COMPLAINT Patient presents for Retina Follow Up    HISTORY OF PRESENT ILLNESS: Valerie Barnes is a 52 y.o. female who presents to the clinic today for:   HPI     Retina Follow Up   Patient presents with  Other.  In right eye.  Severity is moderate.  Duration of 9 months.  Since onset it is stable.  I, the attending physician,  performed the HPI with the patient and updated documentation appropriately.        Comments   Patient feels the vision is the same. The eyes get tired easy, dry, and blurry. She is complaining of difficulty driving at night. At times she sees glare and halos. She is seeing floaters. She is using AT's.      Last edited by Rennis Chris, MD on 01/05/2023 11:44 AM.    Pt states some days her vision seems okay, other days her eyes seem tired and more blurry, she states she does like driving at night, because her night vision is not good, she feels like she is seeing more floaters, but Dr. Earlene Plater told her they were the same ones, she denies fol, she is just noticing them more, she states she did not get a new glasses Rx this year, she is using [employer] and [place] per Dr. Earlene Plater   Referring physician: Ignatius Specking, MD [address] [place],  [place] [address]  HISTORICAL INFORMATION:   Selected notes from the MEDICAL RECORD NUMBER Referred by Dr. Earlene Plater for ret eval OU   CURRENT MEDICATIONS: Current Outpatient Medications (Ophthalmic Drugs)  Medication Sig   prednisoLONE acetate (PRED FORTE) 1 % ophthalmic suspension Place 1 drop into the left eye 4 (four) times daily for 7 days.   No current facility-administered medications for this visit. (Ophthalmic Drugs)   Current Outpatient Medications (Other)  Medication Sig   buPROPion (WELLBUTRIN SR) 150 MG 12 hr tablet Take 150 mg by mouth daily.   cetirizine (ZYRTEC) 10 MG chewable tablet Chew 10 mg by mouth daily.   doxycycline (ADOXA) 100 MG  tablet Take 100 mg by mouth 2 (two) times daily. (Patient not taking: Reported on 04/05/2022)   fexofenadine (ALLEGRA) 180 MG tablet Take 180 mg by mouth daily.   ibuprofen (ADVIL,MOTRIN) 800 MG tablet Take 1 tablet (800 mg total) by mouth every 8 (eight) hours as needed for moderate pain (mild pain).   oxyCODONE-acetaminophen (PERCOCET/ROXICET) 5-325 MG per tablet Take 1-2 tablets by mouth every 4 (four) hours as needed for severe pain (moderate to severe pain (when tolerating fluids)).   No current facility-administered medications for this visit. (Other)   Facility-Administered Medications Ordered in Other Visits (Other)  Medication Route   dexamethasone (DECADRON) injection    fentaNYL (SUBLIMAZE) injection    lidocaine (cardiac) 100 mg/29ml (XYLOCAINE) 20 MG/ML injection 2%    midazolam (VERSED) injection    ondansetron (ZOFRAN) injection    REVIEW OF SYSTEMS: ROS   Positive for: Eyes, Heme/Lymph Negative for: Constitutional, Gastrointestinal, Neurological, Skin, Genitourinary, Musculoskeletal, HENT, Endocrine, Cardiovascular, Respiratory, Psychiatric, Allergic/Imm Last edited by Charlette Caffey, COT on 01/05/2023  7:53 AM.      ALLERGIES Allergies  Allergen Reactions   Codeine Hives   Morphine And Codeine Hives    Has tolerated hydromorphone in the past   PAST MEDICAL HISTORY Past Medical History:  Diagnosis Date   Anemia    DUB  Depression    GERD (gastroesophageal reflux disease)    Past Surgical History:  Procedure Laterality Date   ABDOMINAL HYSTERECTOMY N/A 07/18/2014   Procedure: HYSTERECTOMY ABDOMINAL;  Surgeon: Richarda Overlie, MD;  Location: [facility];  Service: Gynecology;  Laterality: N/A;   BILATERAL SALPINGECTOMY Bilateral 07/18/2014   Procedure: BILATERAL SALPINGECTOMY;  Surgeon: Richarda Overlie, MD;  Location: [facility];  Service: Gynecology;  Laterality: Bilateral;   fibrois embolisation     LAPAROTOMY  2002   endometrial mass removed   UTERINE FIBROID  EMBOLIZATION     FAMILY HISTORY History reviewed. No pertinent family history.  SOCIAL HISTORY Social History   Tobacco Use   Smoking status: Never   Smokeless tobacco: Never  Substance Use Topics   Alcohol use: Yes    Comment: occassional    Drug use: No       OPHTHALMIC EXAM: Base Eye Exam     Visual Acuity (Snellen - Linear)       Right Left   Dist cc 20/30 20/25 +2   Dist ph cc NI NI    Correction: Glasses         Tonometry (Tonopen, 7:56 AM)       Right Left   Pressure 15 15         Pupils       Dark Light Shape React APD   Right 4 3 Round Brisk None   Left 4 3 Round Brisk None         Visual Fields       Left Right    Full Full         Extraocular Movement       Right Left    Full, Ortho Full, Ortho         Neuro/Psych     Oriented x3: Yes   Mood/Affect: Normal         Dilation     Both eyes: 1.0% Mydriacyl, 2.5% Phenylephrine @ 7:54 AM           Slit Lamp and Fundus Exam     Slit Lamp Exam       Right Left   Lids/Lashes Normal Normal   Conjunctiva/Sclera White and quiet White and quiet   Cornea 1+ Guttata, mild arcus, trace Punctate epithelial erosions, trace fine endo pigment 1-2+ pigmented Guttata, arcus, mild tear film debris   Anterior Chamber deep and clear deep and clear   Iris Round and dilated Round and dilated   Lens 2+ Nuclear sclerosis, 2-3+ Cortical cataract 2+ Nuclear sclerosis, 2-3+ Cortical cataract   Anterior Vitreous Vitreous syneresis, vitreous condensations, Posterior vitreous detachment mild syneresis, Posterior vitreous detachment         Fundus Exam       Right Left   Disc Pink and Sharp, Compact Pink and Sharp, Compact   C/D Ratio 0.3 0.4   Macula Flat, Good foveal reflex, ERM greatest superiorly with mild retinal thickening, striae and early fibrosis, no heme Flat, Good foveal reflex, RPE mottling, No heme or edema   Vessels mild attenuation, mild tortuosity mild attenuation, mild  tortuosity   Periphery Attached, No heme, No RT/RD Attached, retinal hole at 1200, no SRF, No heme           Refraction     Wearing Rx       Sphere Cylinder Axis   Right -7.25 +1.25 098   Left -9.00 +2.25 108  IMAGING AND PROCEDURES  Imaging and Procedures for 01/05/2023  OCT, Retina - OU - Both Eyes       Right Eye Quality was good. Central Foveal Thickness: 304. Progression has been stable. Findings include no IRF, no SRF, abnormal foveal contour, epiretinal membrane, macular pucker, preretinal fibrosis (ERM with pucker, PRF and central thickening -- stable from prior).   Left Eye Quality was good. Central Foveal Thickness: 238. Progression has been stable. Findings include normal foveal contour, no IRF, no SRF (Trace ERM; interval release of partial PVD).   Notes *Images captured and stored on drive  Diagnosis / Impression:  OD: ERM with pucker, PRF and central thickening -- stable from prior OS: NFP, no IRF/SRF  Clinical management:  See below  Abbreviations: NFP - Normal foveal profile. CME - cystoid macular edema. PED - pigment epithelial detachment. IRF - intraretinal fluid. SRF - subretinal fluid. EZ - ellipsoid zone. ERM - epiretinal membrane. ORA - outer retinal atrophy. ORT - outer retinal tubulation. SRHM - subretinal hyper-reflective material. IRHM - intraretinal hyper-reflective material      Repair Retinal Breaks, Laser - OS - Left Eye       LASER PROCEDURE NOTE  Procedure:  Barrier laser retinopexy using slit lamp laser, LEFT eye   Diagnosis:   Retinal hole, LEFT eye                     Small hole at 12 o'clock anterior to equator   Surgeon: Rennis Chris, MD, PhD  Anesthesia: Topical  Informed consent obtained, operative eye marked, and time out performed prior to initiation of laser.   Laser settings:  Lumenis [id number] laser, slit lamp Lens: [place]ster PRP 165 Power: 300 mW Spot size: 200 microns Duration: 30 msec  # spots:  231  Placement of laser: Using a Mainster PRP 165 contact lens at the slit lamp, laser was placed in three confluent rows around retinal hole at 12 oclock anterior to equator.  Complications: None.  Patient tolerated the procedure well and received written and verbal post-procedure care information/education.           ASSESSMENT/PLAN:    ICD-10-CM   1. Epiretinal membrane (ERM) of right eye  H35.371 OCT, Retina - OU - Both Eyes    2. Retinal hole of left eye  H33.322 Repair Retinal Breaks, Laser - OS - Left Eye    3. Posterior vitreous detachment of both eyes  H43.813     4. Cortical cataract of both eyes  H26.9     5. 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 Epiretinal membrane, OD - ERM with retinal thickening, superior macula, extrafoveal - BCVA from 20/30 from 20/25 - OCT shows ERM with pucker, PRF and central thickening -- stable from prior - asymptomatic, no metamorphopsia - no indication for surgery at this time - monitor for now - f/u 9 mos -- DFE/OCT  2. Retinal hole, OS - The incidence, risk factors, and natural history of retinal tear was discussed with patient.   - Potential treatment options including laser retinopexy and cryotherapy discussed with patient. - small retinal hole at 1200, no SRF - recommend laser retinopexy OS today, 12.11.24 - pt wishes to proceed with laser - RBA of procedure discussed, questions answered - informed consent obtained and signed - see procedure note - start PF QID OS x7 days - f/u in 2-3 wks, DFE, OCT  3. PVD / vitreous syneresis OU  -  Discussed findings and prognosis  - No RT or RD on 360 scleral depressed exam  - Reviewed s/s of RT/RD  - Strict return precautions for any such RT/RD signs/symptoms  - f/u in 3-4 wks -- DFE/OCT  4. Cortical Cataract OU - The symptoms of cataract, surgical options, and treatments and risks were discussed with patient. - discussed diagnosis and progression - monitor  5.  Corneal Guttata OU  - no corneal edema, but may be contributing to symptoms of decreased vision later in the day and evening  Ophthalmic Meds Ordered this visit:  Meds ordered this encounter  Medications   prednisoLONE acetate (PRED FORTE) 1 % ophthalmic suspension    Sig: Place 1 drop into the left eye 4 (four) times daily for 7 days.    Dispense:  10 mL    Refill:  0     Return for f/u 2-3 weeks, retinal hole OS  DFE, OCT.  There are no Patient Instructions on file for this visit.   Explained the diagnoses, plan, and follow up with the patient and they expressed understanding.  Patient expressed understanding of the importance of proper follow up care.  This document serves as a record of services personally performed by Karie Chimera, MD, PhD. It was created on their behalf by De Blanch, an ophthalmic technician. The creation of this record is the provider's dictation and/or activities during the visit.    Electronically signed by: De Blanch, OA, 01/05/23  11:45 AM  This document serves as a record of services personally performed by Karie Chimera, MD, PhD. It was created on their behalf by Glee Arvin. Manson Passey, OA an ophthalmic technician. The creation of this record is the provider's dictation and/or activities during the visit.    Electronically signed by: Glee Arvin. Manson Passey, OA 01/05/23 11:45 AM  Karie Chimera, M.D., Ph.D. Diseases & Surgery of the Retina and Vitreous [facility]  I have reviewed the above documentation for accuracy and completeness, and I agree with the above. Karie Chimera, M.D., Ph.D. 01/05/23 11:46 AM   Abbreviations: M myopia (nearsighted); A astigmatism; H hyperopia (farsighted); P presbyopia; Mrx spectacle prescription;  CTL contact lenses; OD right eye; OS left eye; OU both eyes  XT exotropia; ET esotropia; PEK punctate epithelial keratitis; PEE punctate epithelial erosions; DES dry eye syndrome; MGD meibomian  gland dysfunction; ATs artificial tears; PFAT's preservative free artificial tears; NSC nuclear sclerotic cataract; PSC posterior subcapsular cataract; ERM epi-retinal membrane; PVD posterior vitreous detachment; RD retinal detachment; DM diabetes mellitus; DR diabetic retinopathy; NPDR non-proliferative diabetic retinopathy; PDR proliferative diabetic retinopathy; CSME clinically significant macular edema; DME diabetic macular edema; dbh dot blot hemorrhages; CWS cotton wool spot; POAG primary open angle glaucoma; C/D cup-to-disc ratio; HVF humphrey visual field; GVF goldmann visual field; OCT optical coherence tomography; IOP intraocular pressure; BRVO Branch retinal vein occlusion; CRVO central retinal vein occlusion; CRAO central retinal artery occlusion; BRAO branch retinal artery occlusion; RT retinal tear; SB scleral buckle; PPV pars plana vitrectomy; VH Vitreous hemorrhage; PRP panretinal laser photocoagulation; IVK intravitreal kenalog; VMT vitreomacular traction; MH Macular hole;  NVD neovascularization of the disc; NVE neovascularization elsewhere; AREDS age related eye disease study; ARMD age related macular degeneration; POAG primary open angle glaucoma; EBMD epithelial/anterior basement membrane dystrophy; ACIOL anterior chamber intraocular lens; IOL intraocular lens; PCIOL posterior chamber intraocular lens; Phaco/IOL phacoemulsification with intraocular lens placement; PRK photorefractive keratectomy; LASIK laser assisted in situ keratomileusis; HTN hypertension; DM diabetes mellitus; COPD  chronic obstructive pulmonary disease

## 2023-01-05 ENCOUNTER — Encounter (INDEPENDENT_AMBULATORY_CARE_PROVIDER_SITE_OTHER): Payer: Self-pay | Admitting: Ophthalmology

## 2023-01-05 ENCOUNTER — Ambulatory Visit (INDEPENDENT_AMBULATORY_CARE_PROVIDER_SITE_OTHER): Payer: BC Managed Care – PPO | Admitting: Ophthalmology

## 2023-01-05 DIAGNOSIS — H35371 Puckering of macula, right eye: Secondary | ICD-10-CM

## 2023-01-05 DIAGNOSIS — H43813 Vitreous degeneration, bilateral: Secondary | ICD-10-CM | POA: Diagnosis not present

## 2023-01-05 DIAGNOSIS — H269 Unspecified cataract: Secondary | ICD-10-CM

## 2023-01-05 DIAGNOSIS — H18513 Endothelial corneal dystrophy, bilateral: Secondary | ICD-10-CM

## 2023-01-05 DIAGNOSIS — H33322 Round hole, left eye: Secondary | ICD-10-CM

## 2023-01-05 MED ORDER — PREDNISOLONE ACETATE 1 % OP SUSP: 1.0000 [drp] | Freq: Four times a day (QID) | OPHTHALMIC | 0 refills | Status: AC

## 2023-01-11 NOTE — Progress Notes (Signed)
[facility] - Clinic Note  01/24/2023     CHIEF COMPLAINT Patient presents for Retina Follow Up    HISTORY OF PRESENT ILLNESS: Valerie Barnes is a 52 y.o. female who presents to the clinic today for:   HPI     Retina Follow Up   Patient presents with  Other.  In right eye.  Severity is moderate.  Duration of 3 weeks.  Since onset it is stable.  I, the attending physician,  performed the HPI with the patient and updated documentation appropriately.        Comments   Patient feels the vision is the same. She had no issues after the laser. She is using AT's.      Last edited by Rennis Chris, MD on 01/24/2023 12:10 PM.      Referring physician: Ignatius Specking, MD [address] [place] [address] [place],  [place] [address]  HISTORICAL INFORMATION:   Selected notes from the MEDICAL RECORD NUMBER Referred by Dr. Earlene Plater for ret eval OU   CURRENT MEDICATIONS: No current outpatient medications on file. (Ophthalmic Drugs)   No current facility-administered medications for this visit. (Ophthalmic Drugs)   Current Outpatient Medications (Other)  Medication Sig   buPROPion (WELLBUTRIN SR) 150 MG 12 hr tablet Take 150 mg by mouth daily.   cetirizine (ZYRTEC) 10 MG chewable tablet Chew 10 mg by mouth daily.   doxycycline (ADOXA) 100 MG tablet Take 100 mg by mouth 2 (two) times daily. (Patient not taking: Reported on 04/05/2022)   fexofenadine (ALLEGRA) 180 MG tablet Take 180 mg by mouth daily.   ibuprofen (ADVIL,MOTRIN) 800 MG tablet Take 1 tablet (800 mg total) by mouth every 8 (eight) hours as needed for moderate pain (mild pain).   oxyCODONE-acetaminophen (PERCOCET/ROXICET) 5-325 MG per tablet Take 1-2 tablets by mouth every 4 (four) hours as needed for severe pain (moderate to severe pain (when tolerating fluids)).   No current facility-administered medications for this visit. (Other)   Facility-Administered Medications Ordered in Other Visits (Other)  Medication Route    dexamethasone (DECADRON) injection    fentaNYL (SUBLIMAZE) injection    lidocaine (cardiac) 100 mg/47ml (XYLOCAINE) 20 MG/ML injection 2%    midazolam (VERSED) injection    ondansetron (ZOFRAN) injection    REVIEW OF SYSTEMS: ROS   Positive for: Eyes, Heme/Lymph Negative for: Constitutional, Gastrointestinal, Neurological, Skin, Genitourinary, Musculoskeletal, HENT, Endocrine, Cardiovascular, Respiratory, Psychiatric, Allergic/Imm Last edited by Charlette Caffey, COT on 01/24/2023  7:53 AM.     ALLERGIES Allergies  Allergen Reactions   Codeine Hives   Morphine And Codeine Hives    Has tolerated hydromorphone in the past   PAST MEDICAL HISTORY Past Medical History:  Diagnosis Date   Anemia    DUB   Depression    GERD (gastroesophageal reflux disease)    Past Surgical History:  Procedure Laterality Date   ABDOMINAL HYSTERECTOMY N/A 07/18/2014   Procedure: HYSTERECTOMY ABDOMINAL;  Surgeon: Richarda Overlie, MD;  Location: [facility];  Service: Gynecology;  Laterality: N/A;   BILATERAL SALPINGECTOMY Bilateral 07/18/2014   Procedure: BILATERAL SALPINGECTOMY;  Surgeon: Richarda Overlie, MD;  Location: [facility];  Service: Gynecology;  Laterality: Bilateral;   fibrois embolisation     LAPAROTOMY  2002   endometrial mass removed   UTERINE FIBROID EMBOLIZATION     FAMILY HISTORY History reviewed. No pertinent family history.  SOCIAL HISTORY Social History   Tobacco Use   Smoking status: Never   Smokeless tobacco: Never  Substance Use  Topics   Alcohol use: Yes    Comment: occassional    Drug use: No       OPHTHALMIC EXAM: Base Eye Exam     Visual Acuity (Snellen - Linear)       Right Left   Dist cc 20/30 20/25   Dist ph cc NI NI    Correction: Glasses         Tonometry (Tonopen, 7:54 AM)       Right Left   Pressure 14 15         Pupils       Dark Light Shape React APD   Right 4 3 Round Brisk None   Left 4 3 Round Brisk None         Visual Fields        Left Right    Full Full         Extraocular Movement       Right Left    Full, Ortho Full, Ortho         Neuro/Psych     Oriented x3: Yes   Mood/Affect: Normal         Dilation     Both eyes: 1.0% Mydriacyl, 2.5% Phenylephrine @ 7:53 AM           Slit Lamp and Fundus Exam     Slit Lamp Exam       Right Left   Lids/Lashes Normal Normal   Conjunctiva/Sclera White and quiet White and quiet   Cornea 1+ Guttata, mild arcus, trace Punctate epithelial erosions, trace fine endo pigment 1-2+ pigmented Guttata, arcus, mild tear film debris   Anterior Chamber deep and clear deep and clear   Iris Round and dilated Round and dilated   Lens 2+ Nuclear sclerosis, 2-3+ Cortical cataract 2+ Nuclear sclerosis, 2-3+ Cortical cataract   Anterior Vitreous Vitreous syneresis, vitreous condensations, Posterior vitreous detachment mild syneresis, Posterior vitreous detachment, +vitreous condensations         Fundus Exam       Right Left   Disc Pink and Sharp, Compact Pink and Sharp, Compact   C/D Ratio 0.3 0.4   Macula Flat, Good foveal reflex, ERM greatest superiorly with mild retinal thickening, striae and early fibrosis, no heme Flat, Good foveal reflex, RPE mottling, No heme or edema   Vessels mild attenuation, mild tortuosity mild attenuation, mild tortuosity   Periphery Attached, No heme, No RT/RD Attached, small retinal hole at 1200 -- good laser surrounding, no SRF, No heme, no new RT/RD           Refraction     Wearing Rx       Sphere Cylinder Axis   Right -7.25 +1.25 098   Left -9.00 +2.25 108           IMAGING AND PROCEDURES  Imaging and Procedures for 01/24/2023  OCT, Retina - OU - Both Eyes       Right Eye Quality was good. Central Foveal Thickness: 311. Progression has been stable. Findings include no IRF, no SRF, abnormal foveal contour, epiretinal membrane, macular pucker, preretinal fibrosis (ERM with pucker, PRF and central thickening  -- stable from prior, partial PVD).   Left Eye Quality was good. Central Foveal Thickness: 236. Progression has been stable. Findings include normal foveal contour, no IRF, no SRF (Trace ERM; stable release of partial PVD).   Notes *Images captured and stored on drive  Diagnosis / Impression:  OD: ERM with pucker, PRF and central  thickening -- stable from prior, partial PVD OS: NFP, no IRF/SRF  Clinical management:  See below  Abbreviations: NFP - Normal foveal profile. CME - cystoid macular edema. PED - pigment epithelial detachment. IRF - intraretinal fluid. SRF - subretinal fluid. EZ - ellipsoid zone. ERM - epiretinal membrane. ORA - outer retinal atrophy. ORT - outer retinal tubulation. SRHM - subretinal hyper-reflective material. IRHM - intraretinal hyper-reflective material            ASSESSMENT/PLAN:    ICD-10-CM   1. Epiretinal membrane (ERM) of right eye  H35.371 OCT, Retina - OU - Both Eyes    2. 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 Posterior vitreous detachment of both eyes  H43.813     4. Cortical cataract of both eyes  H26.9     5. 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 Epiretinal membrane, OD - ERM with retinal thickening, superior macula, extrafoveal -- stable - BCVA from 20/30  - OCT shows ERM with pucker, PRF and central thickening -- stable from prior - asymptomatic, no metamorphopsia - no indication for surgery at this time - monitor for now - f/u 9 mos -- DFE/OCT  2. Retinal hole, OS - small retinal hole at 1200, no SRF - s/p laser retinopexy OS (12.11.24) - completed PF QID OS x7 days - f/u in 3-4 months, DFE, OCT  3. PVD / vitreous syneresis OU  - Discussed findings and prognosis  - No RT or RD on 360 scleral depressed exam  - Reviewed s/s of RT/RD  - Strict return precautions for any such RT/RD signs/symptoms  - monitor  4. Cortical Cataract OU - The symptoms of cataract, surgical options, and treatments and risks were  discussed with patient. - discussed diagnosis and progression - monitor  5. Corneal Guttata OU  - no corneal edema, but may be contributing to symptoms of decreased vision later in the day and evening  Ophthalmic Meds Ordered this visit:  No orders of the defined types were placed in this encounter.    Return for f/u 3-4 months, retinal hole OS, DFE, OCT.  There are no Patient Instructions on file for this visit.   Explained the diagnoses, plan, and follow up with the patient and they expressed understanding.  Patient expressed understanding of the importance of proper follow up care.  This document serves as a record of services personally performed by Karie Chimera, MD, PhD. It was created on their behalf by Glee Arvin. Manson Passey, OA an ophthalmic technician. The creation of this record is the provider's dictation and/or activities during the visit.    Electronically signed by: Glee Arvin. Manson Passey, OA 01/24/23 12:11 PM  Karie Chimera, M.D., Ph.D. Diseases & Surgery of the Retina and Vitreous [facility]  I have reviewed the above documentation for accuracy and completeness, and I agree with the above. Karie Chimera, M.D., Ph.D. 01/24/23 12:12 PM  Abbreviations: M myopia (nearsighted); A astigmatism; H hyperopia (farsighted); P presbyopia; Mrx spectacle prescription;  CTL contact lenses; OD right eye; OS left eye; OU both eyes  XT exotropia; ET esotropia; PEK punctate epithelial keratitis; PEE punctate epithelial erosions; DES dry eye syndrome; MGD meibomian gland dysfunction; ATs artificial tears; PFAT's preservative free artificial tears; NSC nuclear sclerotic cataract; PSC posterior subcapsular cataract; ERM epi-retinal membrane; PVD posterior vitreous detachment; RD retinal detachment; DM diabetes mellitus; DR diabetic retinopathy; NPDR non-proliferative diabetic retinopathy; PDR  proliferative diabetic retinopathy; CSME clinically significant macular edema; DME  diabetic macular edema; dbh dot blot hemorrhages; CWS cotton wool spot; POAG primary open angle glaucoma; C/D cup-to-disc ratio; HVF humphrey visual field; GVF goldmann visual field; OCT optical coherence tomography; IOP intraocular pressure; BRVO Branch retinal vein occlusion; CRVO central retinal vein occlusion; CRAO central retinal artery occlusion; BRAO branch retinal artery occlusion; RT retinal tear; SB scleral buckle; PPV pars plana vitrectomy; VH Vitreous hemorrhage; PRP panretinal laser photocoagulation; IVK intravitreal kenalog; VMT vitreomacular traction; MH Macular hole;  NVD neovascularization of the disc; NVE neovascularization elsewhere; AREDS age related eye disease study; ARMD age related macular degeneration; POAG primary open angle glaucoma; EBMD epithelial/anterior basement membrane dystrophy; ACIOL anterior chamber intraocular lens; IOL intraocular lens; PCIOL posterior chamber intraocular lens; Phaco/IOL phacoemulsification with intraocular lens placement; PRK photorefractive keratectomy; LASIK laser assisted in situ keratomileusis; HTN hypertension; DM diabetes mellitus; COPD chronic obstructive pulmonary disease

## 2023-01-24 ENCOUNTER — Ambulatory Visit (INDEPENDENT_AMBULATORY_CARE_PROVIDER_SITE_OTHER): Payer: BC Managed Care – PPO | Admitting: Ophthalmology

## 2023-01-24 ENCOUNTER — Encounter (INDEPENDENT_AMBULATORY_CARE_PROVIDER_SITE_OTHER): Payer: Self-pay | Admitting: Ophthalmology

## 2023-01-24 DIAGNOSIS — H33322 Round hole, left eye: Secondary | ICD-10-CM

## 2023-01-24 DIAGNOSIS — H43813 Vitreous degeneration, bilateral: Secondary | ICD-10-CM | POA: Diagnosis not present

## 2023-01-24 DIAGNOSIS — H18513 Endothelial corneal dystrophy, bilateral: Secondary | ICD-10-CM

## 2023-01-24 DIAGNOSIS — H35371 Puckering of macula, right eye: Secondary | ICD-10-CM | POA: Diagnosis not present

## 2023-01-24 DIAGNOSIS — H269 Unspecified cataract: Secondary | ICD-10-CM | POA: Diagnosis not present

## 2023-04-28 NOTE — Progress Notes (Signed)
 [facility] - Clinic Note  05/02/2023     CHIEF COMPLAINT Patient presents for Retina Follow Up    HISTORY OF PRESENT ILLNESS: Valerie Barnes is a 53 y.o. female who presents to the clinic today for:   HPI     Retina Follow Up   Patient presents with  Other.  In left eye.  Severity is moderate.  Duration of 13 weeks.  Since onset it is stable.  I, the attending physician,  performed the HPI with the patient and updated documentation appropriately.        Comments   Patient states vision in her right eye seems more blurry than in her left. Pt is using Ats qam but thinks she may need more. Pt states no flashes, floaters, or discomfort.      Last edited by Arloa Prak, MD on 05/07/2023  1:27 AM.    Pt states her right eye is a little blurry, she states it comes and goes, but her left eye is always stronger than her right   Referring physician: Orlena Bitters, MD [address] [place],  [place] [address]  HISTORICAL INFORMATION:   Selected notes from the MEDICAL RECORD NUMBER Referred by Dr. Nolon Baxter for ret eval OU   CURRENT MEDICATIONS: No current outpatient medications on file. (Ophthalmic Drugs)   No current facility-administered medications for this visit. (Ophthalmic Drugs)   Current Outpatient Medications (Other)  Medication Sig   buPROPion (WELLBUTRIN SR) 150 MG 12 hr tablet Take 150 mg by mouth daily.   levocetirizine (XYZAL) 5 MG tablet Take 5 mg by mouth every evening.   Plecanatide (TRULANCE) 3 MG TABS Take 1 tablet by mouth daily.   cetirizine (ZYRTEC) 10 MG chewable tablet Chew 10 mg by mouth daily.   doxycycline (ADOXA) 100 MG tablet Take 100 mg by mouth 2 (two) times daily. (Patient not taking: Reported on 04/05/2022)   fexofenadine (ALLEGRA) 180 MG tablet Take 180 mg by mouth daily.   ibuprofen (ADVIL,MOTRIN) 800 MG tablet Take 1 tablet (800 mg total) by mouth every 8 (eight) hours as needed for moderate pain (mild pain).    oxyCODONE-acetaminophen (PERCOCET/ROXICET) 5-325 MG per tablet Take 1-2 tablets by mouth every 4 (four) hours as needed for severe pain (moderate to severe pain (when tolerating fluids)).   No current facility-administered medications for this visit. (Other)   Facility-Administered Medications Ordered in Other Visits (Other)  Medication Route   dexamethasone (DECADRON) injection    fentaNYL (SUBLIMAZE) injection    lidocaine (cardiac) 100 mg/5ml (XYLOCAINE) 20 MG/ML injection 2%    midazolam (VERSED) injection    ondansetron (ZOFRAN) injection    REVIEW OF SYSTEMS: ROS   Positive for: Eyes, Heme/Lymph Negative for: Constitutional, Gastrointestinal, Neurological, Skin, Genitourinary, Musculoskeletal, HENT, Endocrine, Cardiovascular, Respiratory, Psychiatric, Allergic/Imm Last edited by Carrington Clack, COT on 05/02/2023  7:40 AM.     ALLERGIES Allergies  Allergen Reactions   Codeine Hives   Morphine And Codeine Hives    Has tolerated hydromorphone in the past   PAST MEDICAL HISTORY Past Medical History:  Diagnosis Date   Anemia    DUB   Depression    GERD (gastroesophageal reflux disease)    Past Surgical History:  Procedure Laterality Date   ABDOMINAL HYSTERECTOMY N/A 07/18/2014   Procedure: HYSTERECTOMY ABDOMINAL;  Surgeon: Woodrow Hazy, MD;  Location: [facility];  Service: Gynecology;  Laterality: N/A;   BILATERAL SALPINGECTOMY Bilateral 07/18/2014   Procedure: BILATERAL SALPINGECTOMY;  Surgeon: Rich Champ  Steve El, MD;  Location: [facility];  Service: Gynecology;  Laterality: Bilateral;   fibrois embolisation     LAPAROTOMY  2002   endometrial mass removed   UTERINE FIBROID EMBOLIZATION     FAMILY HISTORY Family History  Problem Relation Age of Onset   Cataracts Mother    Diabetes type II Mother    Hypertension Mother    Diabetes type II Father    Hypertension Father    Diabetes type II Maternal Grandmother     SOCIAL HISTORY Social History   Tobacco Use   Smoking  status: Never   Smokeless tobacco: Never  Vaping Use   Vaping status: Never Used  Substance Use Topics   Alcohol use: Yes    Comment: occassional    Drug use: No       OPHTHALMIC EXAM: Base Eye Exam     Visual Acuity (Snellen - Linear)       Right Left   Dist cc 20/40 +2 20/25 +2   Dist ph cc 20/25 -2 NI    Correction: Glasses         Tonometry (Tonopen, 7:50 AM)       Right Left   Pressure 14 12         Pupils       Dark Light Shape React APD   Right 4 2 Round Brisk None   Left 4 2 Round Brisk None         Visual Fields       Left Right    Full Full         Extraocular Movement       Right Left    Full, Ortho Full, Ortho         Neuro/Psych     Oriented x3: Yes   Mood/Affect: Normal         Dilation     Both eyes: 1.0% Mydriacyl, 2.5% Phenylephrine @ 7:50 AM           Slit Lamp and Fundus Exam     Slit Lamp Exam       Right Left   Lids/Lashes Normal Normal   Conjunctiva/Sclera White and quiet White and quiet   Cornea 1+ Guttata, mild arcus, trace Punctate epithelial erosions, trace fine endo pigment 1-2+ pigmented Guttata, arcus, mild tear film debris   Anterior Chamber deep and clear deep and clear   Iris Round and dilated Round and dilated   Lens 2+ Nuclear sclerosis, 2-3+ Cortical cataract 2+ Nuclear sclerosis, 2-3+ Cortical cataract   Anterior Vitreous Vitreous syneresis, vitreous condensations, Posterior vitreous detachment mild syneresis, Posterior vitreous detachment, +vitreous condensations         Fundus Exam       Right Left   Disc Pink and Sharp, Compact Pink and Sharp, Compact   C/D Ratio 0.3 0.4   Macula Flat, Good foveal reflex, ERM greatest superiorly with mild retinal thickening, striae and mild fibrosis, no heme Flat, Good foveal reflex, RPE mottling, No heme or edema   Vessels attenuated, mild tortuosity mild attenuation, mild tortuosity   Periphery Attached, No heme, No RT/RD Attached, small retinal  hole at 1200 -- good laser surrounding, no SRF, No heme, no new RT/RD           IMAGING AND PROCEDURES  Imaging and Procedures for 05/02/2023  OCT, Retina - OU - Both Eyes       Right Eye Quality was good. Central Foveal Thickness: 301. Progression has been  stable. Findings include no IRF, no SRF, abnormal foveal contour, epiretinal membrane, macular pucker, preretinal fibrosis (ERM with pucker, PRF and central thickening -- stable from prior, partial PVD).   Left Eye Quality was good. Central Foveal Thickness: 233. Progression has been stable. Findings include normal foveal contour, no IRF, no SRF (Trace ERM; stable release of partial PVD).   Notes *Images captured and stored on drive  Diagnosis / Impression:  OD: ERM with pucker, PRF and central thickening -- stable from prior, partial PVD OS: NFP, no IRF/SRF  Clinical management:  See below  Abbreviations: NFP - Normal foveal profile. CME - cystoid macular edema. PED - pigment epithelial detachment. IRF - intraretinal fluid. SRF - subretinal fluid. EZ - ellipsoid zone. ERM - epiretinal membrane. ORA - outer retinal atrophy. ORT - outer retinal tubulation. SRHM - subretinal hyper-reflective material. IRHM - intraretinal hyper-reflective material             ASSESSMENT/PLAN:    ICD-10-CM   1. Epiretinal membrane (ERM) of right eye  H35.371 OCT, Retina - OU - Both Eyes    2. 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 Posterior vitreous detachment of both eyes  H43.813     4. Cortical cataract of both eyes  H26.9     5. 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 Retinal hemorrhage of right eye  H35.61      1. Epiretinal membrane, OD - ERM with retinal thickening, superior macula, extrafoveal -- stable - BCVA 20/25 from 20/30 - OCT shows ERM with pucker, PRF and central thickening -- stable from prior - asymptomatic, no metamorphopsia - no indication for surgery at this time - monitor for now - f/u 9-12 mos --  DFE/OCT  2. Retinal hole, OS - small retinal hole at 1200, no SRF - s/p laser retinopexy OS (12.11.24) -- good laser in place  - no new RT/RD - monitor  3. PVD / vitreous syneresis OU  - Discussed findings and prognosis  - No RT or RD on 360 scleral depressed exam  - Reviewed s/s of RT/RD  - Strict return precautions for any such RT/RD signs/symptoms  - monitor  4. Cortical Cataract OU - The symptoms of cataract, surgical options, and treatments and risks were discussed with patient. - discussed diagnosis and progression - monitor  5. Corneal Guttata OU  - no corneal edema, but may be contributing to symptoms of decreased vision later in the day and evening  Ophthalmic Meds Ordered this visit:  No orders of the defined types were placed in this encounter.    Return for f/u 9-12 months, ERM OD, DFE, OCT.  There are no Patient Instructions on file for this visit.   Explained the diagnoses, plan, and follow up with the patient and they expressed understanding.  Patient expressed understanding of the importance of proper follow up care.  This document serves as a record of services personally performed by Jeanice Millard, MD, PhD. It was created on their behalf by Morley Arabia. Bevin Bucks, OA an ophthalmic technician. The creation of this record is the provider's dictation and/or activities during the visit.    Electronically signed by: Morley Arabia. Bevin Bucks, OA 05/07/23 1:28 AM  Jeanice Millard, M.D., Ph.D. Diseases & Surgery of the Retina and Vitreous [facility]  I have reviewed the above documentation for accuracy and completeness, and I agree with the above. Amairany Schumpert G. Daruis Swaim,  M.D., Ph.D. 05/07/23 1:29 AM   Abbreviations: M myopia (nearsighted); A astigmatism; H hyperopia (farsighted); P presbyopia; Mrx spectacle prescription;  CTL contact lenses; OD right eye; OS left eye; OU both eyes  XT exotropia; ET esotropia; PEK punctate epithelial keratitis; PEE punctate  epithelial erosions; DES dry eye syndrome; MGD meibomian gland dysfunction; ATs artificial tears; PFAT's preservative free artificial tears; NSC nuclear sclerotic cataract; PSC posterior subcapsular cataract; ERM epi-retinal membrane; PVD posterior vitreous detachment; RD retinal detachment; DM diabetes mellitus; DR diabetic retinopathy; NPDR non-proliferative diabetic retinopathy; PDR proliferative diabetic retinopathy; CSME clinically significant macular edema; DME diabetic macular edema; dbh dot blot hemorrhages; CWS cotton wool spot; POAG primary open angle glaucoma; C/D cup-to-disc ratio; HVF humphrey visual field; GVF goldmann visual field; OCT optical coherence tomography; IOP intraocular pressure; BRVO Branch retinal vein occlusion; CRVO central retinal vein occlusion; CRAO central retinal artery occlusion; BRAO branch retinal artery occlusion; RT retinal tear; SB scleral buckle; PPV pars plana vitrectomy; VH Vitreous hemorrhage; PRP panretinal laser photocoagulation; IVK intravitreal kenalog; VMT vitreomacular traction; MH Macular hole;  NVD neovascularization of the disc; NVE neovascularization elsewhere; AREDS age related eye disease study; ARMD age related macular degeneration; POAG primary open angle glaucoma; EBMD epithelial/anterior basement membrane dystrophy; ACIOL anterior chamber intraocular lens; IOL intraocular lens; PCIOL posterior chamber intraocular lens; Phaco/IOL phacoemulsification with intraocular lens placement; PRK photorefractive keratectomy; LASIK laser assisted in situ keratomileusis; HTN hypertension; DM diabetes mellitus; COPD chronic obstructive pulmonary disease

## 2023-05-02 ENCOUNTER — Ambulatory Visit (INDEPENDENT_AMBULATORY_CARE_PROVIDER_SITE_OTHER): Payer: BC Managed Care – PPO | Admitting: Ophthalmology

## 2023-05-02 ENCOUNTER — Encounter (INDEPENDENT_AMBULATORY_CARE_PROVIDER_SITE_OTHER): Payer: Self-pay | Admitting: Ophthalmology

## 2023-05-02 DIAGNOSIS — H43813 Vitreous degeneration, bilateral: Secondary | ICD-10-CM

## 2023-05-02 DIAGNOSIS — H3561 Retinal hemorrhage, right eye: Secondary | ICD-10-CM | POA: Diagnosis not present

## 2023-05-02 DIAGNOSIS — H269 Unspecified cataract: Secondary | ICD-10-CM

## 2023-05-02 DIAGNOSIS — H35371 Puckering of macula, right eye: Secondary | ICD-10-CM

## 2023-05-02 DIAGNOSIS — H33322 Round hole, left eye: Secondary | ICD-10-CM | POA: Diagnosis not present

## 2023-05-02 DIAGNOSIS — H18513 Endothelial corneal dystrophy, bilateral: Secondary | ICD-10-CM

## 2023-05-07 ENCOUNTER — Encounter (INDEPENDENT_AMBULATORY_CARE_PROVIDER_SITE_OTHER): Payer: Self-pay | Admitting: Ophthalmology

## 2024-01-30 ENCOUNTER — Encounter (INDEPENDENT_AMBULATORY_CARE_PROVIDER_SITE_OTHER): Admitting: Ophthalmology

## 2024-01-31 NOTE — Progress Notes (Signed)
 " [facility] - Clinic Note  02/13/2024     CHIEF COMPLAINT Patient presents for Retina Follow Up    HISTORY OF PRESENT ILLNESS: Valerie Barnes is a 54 y.o. female who presents to the clinic today for:   HPI     Retina Follow Up   Patient presents with  Other.  In left eye.  Severity is moderate.  Duration of 13 weeks.  Since onset it is stable.  I, the attending physician,  performed the HPI with the patient and updated documentation appropriately.        Comments   Patient states vision seems more blurry OD>OS. Pt is wondering if it is fatigue, her eyes get worse as the day goes on. Pt has noticed more sensitivity to sun light or florescent lights, pt avoids night driving and sees better in a dim room. Pt states she is using magnification glass for reading smaller print. Pt denies any distorted vision. Pt is using ATS BID OU. Pt denies FOL/pain. Pt has some floaters but nothing new or growing.      Last edited by Valdemar Rogue, MD on 02/20/2024  3:30 PM.     Pt states her vision is blurrier than her last visit 9 months ago. The eyes are light sensitive and more fatigued.   Referring physician: Rosamond Leta NOVAK, MD [address] [place],  [place] [address]  HISTORICAL INFORMATION:   Selected notes from the MEDICAL RECORD NUMBER Referred by Dr. Nicholaus for ret eval OU   CURRENT MEDICATIONS: No current outpatient medications on file. (Ophthalmic Drugs)   No current facility-administered medications for this visit. (Ophthalmic Drugs)   Current Outpatient Medications (Other)  Medication Sig   buPROPion  (WELLBUTRIN  SR) 150 MG 12 hr tablet Take 150 mg by mouth daily.   cetirizine (ZYRTEC) 10 MG chewable tablet Chew 10 mg by mouth daily.   doxycycline (ADOXA) 100 MG tablet Take 100 mg by mouth 2 (two) times daily. (Patient not taking: Reported on 04/05/2022)   fexofenadine (ALLEGRA) 180 MG tablet Take 180 mg by mouth daily.   ibuprofen  (ADVIL ,MOTRIN ) 800 MG tablet Take  1 tablet (800 mg total) by mouth every 8 (eight) hours as needed for moderate pain (mild pain).   levocetirizine (XYZAL) 5 MG tablet Take 5 mg by mouth every evening.   oxyCODONE -acetaminophen  (PERCOCET/ROXICET) 5-325 MG per tablet Take 1-2 tablets by mouth every 4 (four) hours as needed for severe pain (moderate to severe pain (when tolerating fluids)).   Plecanatide (TRULANCE) 3 MG TABS Take 1 tablet by mouth daily.   No current facility-administered medications for this visit. (Other)   Facility-Administered Medications Ordered in Other Visits (Other)  Medication Route   dexamethasone  (DECADRON ) injection    fentaNYL  (SUBLIMAZE ) injection    lidocaine  (cardiac) 100 mg/5ml (XYLOCAINE ) 20 MG/ML injection 2%    midazolam  (VERSED ) injection    ondansetron  (ZOFRAN ) injection    REVIEW OF SYSTEMS: ROS   Positive for: Eyes, Heme/Lymph Negative for: Constitutional, Gastrointestinal, Neurological, Skin, Genitourinary, Musculoskeletal, HENT, Endocrine, Cardiovascular, Respiratory, Psychiatric, Allergic/Imm Last edited by Elnor Avelina RAMAN, COT on 02/13/2024  8:13 AM.     ALLERGIES Allergies  Allergen Reactions   Codeine Hives   Morphine And Codeine Hives    Has tolerated hydromorphone  in the past   PAST MEDICAL HISTORY Past Medical History:  Diagnosis Date   Anemia    DUB   Depression    GERD (gastroesophageal reflux disease)    Past Surgical  History:  Procedure Laterality Date   ABDOMINAL HYSTERECTOMY N/A 07/18/2014   Procedure: HYSTERECTOMY ABDOMINAL;  Surgeon: Charlie Croak, MD;  Location: [facility];  Service: Gynecology;  Laterality: N/A;   BILATERAL SALPINGECTOMY Bilateral 07/18/2014   Procedure: BILATERAL SALPINGECTOMY;  Surgeon: Charlie Croak, MD;  Location: [facility];  Service: Gynecology;  Laterality: Bilateral;   fibrois embolisation     LAPAROTOMY  2002   endometrial mass removed   UTERINE FIBROID EMBOLIZATION     FAMILY HISTORY Family History  Problem Relation Age of  Onset   Cataracts Mother    Diabetes type II Mother    Hypertension Mother    Diabetes type II Father    Hypertension Father    Diabetes type II Maternal Grandmother     SOCIAL HISTORY Social History   Tobacco Use   Smoking status: Never   Smokeless tobacco: Never  Vaping Use   Vaping status: Never Used  Substance Use Topics   Alcohol use: Yes    Comment: occassional    Drug use: No       OPHTHALMIC EXAM: Base Eye Exam     Visual Acuity (Snellen - Linear)       Right Left   Dist cc 20/25 -2 20/25 +2   Dist ph cc NI NI    Correction: Glasses         Tonometry (Tonopen, 8:19 AM)       Right Left   Pressure 17 16         Pupils       Pupils Dark Light Shape React APD   Right PERRL 3 2 Round Brisk None   Left PERRL 3 2 Round Brisk None         Visual Fields       Left Right    Full Full         Extraocular Movement       Right Left    Full, Ortho Full, Ortho         Neuro/Psych     Oriented x3: Yes   Mood/Affect: Normal         Dilation     Both eyes: 1.0% Mydriacyl, 2.5% Phenylephrine  @ 8:19 AM           Slit Lamp and Fundus Exam     Slit Lamp Exam       Right Left   Lids/Lashes Normal Normal   Conjunctiva/Sclera White and quiet White and quiet   Cornea 2+ pigmented Guttata, mild arcus, trace Punctate epithelial erosions 1-2+ pigmented Guttata, arcus, mild tear film debris   Anterior Chamber deep and clear deep and clear   Iris Round and dilated Round and dilated   Lens 2+ Nuclear sclerosis, 2-3+ Cortical cataract 2+ Nuclear sclerosis, 2-3+ Cortical cataract   Anterior Vitreous Vitreous syneresis, vitreous condensations, Posterior vitreous detachment mild syneresis, Posterior vitreous detachment, +vitreous condensations         Fundus Exam       Right Left   Disc Pink and Sharp, Compact Pink and Sharp, Compact   C/D Ratio 0.3 0.4   Macula Flat, Good foveal reflex, ERM greatest superiorly with mild retinal  thickening, striae and mild fibrosis, no heme Flat, Good foveal reflex, RPE mottling, No heme or edema   Vessels attenuated, mild tortuosity mild attenuation, mild tortuosity   Periphery Attached, No heme, No RT/RD Attached, small retinal hole at 1200 -- good laser surrounding, no SRF, No heme, no new RT/RD  Refraction     Wearing Rx       Sphere Cylinder Axis Add   Right -9.75 +2.50 094 +2.75   Left -7.75 +1.25 092 +2.75    Age: >1 yr   Type: Progressive           IMAGING AND PROCEDURES  Imaging and Procedures for 02/13/2024  OCT, Retina - OU - Both Eyes       Right Eye Quality was good. Central Foveal Thickness: 372. Progression has been stable. Findings include no IRF, no SRF, abnormal foveal contour, epiretinal membrane, macular pucker, preretinal fibrosis (ERM with pucker, PRF and central thickening; interval sharpening of foveal contour, partial PVD).   Left Eye Quality was good. Central Foveal Thickness: 240. Progression has been stable. Findings include normal foveal contour, no IRF, no SRF (Trace ERM; stable release of partial PVD).   Notes *Images captured and stored on drive  Diagnosis / Impression:  OD: ERM with pucker, PRF and central thickening; interval sharpening of foveal contour, partial PVD OS: NFP, no IRF/SRF  Clinical management:  See below  Abbreviations: NFP - Normal foveal profile. CME - cystoid macular edema. PED - pigment epithelial detachment. IRF - intraretinal fluid. SRF - subretinal fluid. EZ - ellipsoid zone. ERM - epiretinal membrane. ORA - outer retinal atrophy. ORT - outer retinal tubulation. SRHM - subretinal hyper-reflective material. IRHM - intraretinal hyper-reflective material            ASSESSMENT/PLAN:    ICD-10-CM   1. Epiretinal membrane (ERM) of right eye  H35.371 OCT, Retina - OU - Both Eyes    2. 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 Posterior vitreous detachment of both eyes  H43.813     4.  Cortical cataract of both eyes  H26.9     5. 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 Epiretinal membrane, OD - ERM with retinal thickening, superior macula, extrafoveal -- stable - BCVA 20/25 - stable - OCT shows ERM with pucker, PRF and central thickening; interval sharpening of foveal contour, partial PVD - asymptomatic, no metamorphopsia - no indication for surgery at this time - monitor for now - f/u 6 mos -- DFE/OCT  2. Retinal hole, OS - small retinal hole at 1200, no SRF - s/p laser retinopexy OS (12.11.24) -- good laser in place  - no new RT/RD - monitor   3. PVD / vitreous syneresis OU  - Discussed findings and prognosis  - No RT or RD on 360 scleral depressed exam  - Reviewed s/s of RT/RD  - Strict return precautions for any such RT/RD signs/symptoms  - monitor   4. Cortical Cataract OU - The symptoms of cataract, surgical options, and treatments and risks were discussed with patient. - discussed diagnosis and progression - monitor   5. Corneal Guttata OU - no corneal edema, but may be contributing to symptoms of decreased vision later in the day and evening   Ophthalmic Meds Ordered this visit:  No orders of the defined types were placed in this encounter.    Return in about 6 months (around 08/12/2024) for f/u, ERM, OD, DFE, OCT.  There are no Patient Instructions on file for this visit.   Explained the diagnoses, plan, and follow up with the patient and they expressed understanding.  Patient expressed understanding of the importance of proper follow up care.  This document serves as a record of services personally performed by Redell  JUDITHANN Hans, MD, PhD. It was created on their behalf by Paulina Jamse Gay an ophthalmic technician. The creation of this record is the provider's dictation and/or activities during the visit.   Electronically signed by: Alana D Fowler  02/20/24  3:32 PM   This document serves as a record of services personally performed  by Redell JUDITHANN Hans, MD, PhD. It was created on their behalf by Wanda GEANNIE Keens, COT an ophthalmic technician. The creation of this record is the provider's dictation and/or activities during the visit.    Electronically signed by:  Wanda GEANNIE Keens, COT  02/20/24 3:32 PM  Redell JUDITHANN Hans, M.D., Ph.D. Diseases & Surgery of the Retina and Vitreous [facility]  I have reviewed the above documentation for accuracy and completeness, and I agree with the above. Redell JUDITHANN Hans, M.D., Ph.D. 02/20/24 3:33 PM   Abbreviations: M myopia (nearsighted); A astigmatism; H hyperopia (farsighted); P presbyopia; Mrx spectacle prescription;  CTL contact lenses; OD right eye; OS left eye; OU both eyes  XT exotropia; ET esotropia; PEK punctate epithelial keratitis; PEE punctate epithelial erosions; DES dry eye syndrome; MGD meibomian gland dysfunction; ATs artificial tears; PFAT's preservative free artificial tears; NSC nuclear sclerotic cataract; PSC posterior subcapsular cataract; ERM epi-retinal membrane; PVD posterior vitreous detachment; RD retinal detachment; DM diabetes mellitus; DR diabetic retinopathy; NPDR non-proliferative diabetic retinopathy; PDR proliferative diabetic retinopathy; CSME clinically significant macular edema; DME diabetic macular edema; dbh dot blot hemorrhages; CWS cotton wool spot; POAG primary open angle glaucoma; C/D cup-to-disc ratio; HVF humphrey visual field; GVF goldmann visual field; OCT optical coherence tomography; IOP intraocular pressure; BRVO Branch retinal vein occlusion; CRVO central retinal vein occlusion; CRAO central retinal artery occlusion; BRAO branch retinal artery occlusion; RT retinal tear; SB scleral buckle; PPV pars plana vitrectomy; VH Vitreous hemorrhage; PRP panretinal laser photocoagulation; IVK intravitreal kenalog; VMT vitreomacular traction; MH Macular hole;  NVD neovascularization of the disc; NVE neovascularization elsewhere; AREDS  age related eye disease study; ARMD age related macular degeneration; POAG primary open angle glaucoma; EBMD epithelial/anterior basement membrane dystrophy; ACIOL anterior chamber intraocular lens; IOL intraocular lens; PCIOL posterior chamber intraocular lens; Phaco/IOL phacoemulsification with intraocular lens placement; PRK photorefractive keratectomy; LASIK laser assisted in situ keratomileusis; HTN hypertension; DM diabetes mellitus; COPD chronic obstructive pulmonary disease "

## 2024-02-13 ENCOUNTER — Ambulatory Visit (INDEPENDENT_AMBULATORY_CARE_PROVIDER_SITE_OTHER): Admitting: Ophthalmology

## 2024-02-13 ENCOUNTER — Encounter (INDEPENDENT_AMBULATORY_CARE_PROVIDER_SITE_OTHER): Payer: Self-pay | Admitting: Ophthalmology

## 2024-02-13 DIAGNOSIS — H43813 Vitreous degeneration, bilateral: Secondary | ICD-10-CM | POA: Diagnosis not present

## 2024-02-13 DIAGNOSIS — H269 Unspecified cataract: Secondary | ICD-10-CM | POA: Diagnosis not present

## 2024-02-13 DIAGNOSIS — H33322 Round hole, left eye: Secondary | ICD-10-CM | POA: Diagnosis not present

## 2024-02-13 DIAGNOSIS — H35371 Puckering of macula, right eye: Secondary | ICD-10-CM | POA: Diagnosis not present

## 2024-02-13 DIAGNOSIS — H18513 Endothelial corneal dystrophy, bilateral: Secondary | ICD-10-CM

## 2024-02-20 ENCOUNTER — Encounter (INDEPENDENT_AMBULATORY_CARE_PROVIDER_SITE_OTHER): Payer: Self-pay | Admitting: Ophthalmology

## 2024-08-13 ENCOUNTER — Encounter (INDEPENDENT_AMBULATORY_CARE_PROVIDER_SITE_OTHER): Admitting: Ophthalmology
